# Patient Record
Sex: Female | Born: 1985
Health system: Southern US, Community
[De-identification: ages and names within clinical notes are randomized; demographics above are authoritative.]

## PROBLEM LIST (undated history)

## (undated) DIAGNOSIS — I10 Essential (primary) hypertension: Secondary | ICD-10-CM

## (undated) DIAGNOSIS — E669 Obesity, unspecified: Secondary | ICD-10-CM

## (undated) DIAGNOSIS — E88819 Insulin resistance, unspecified: Secondary | ICD-10-CM

## (undated) DIAGNOSIS — E8881 Metabolic syndrome: Secondary | ICD-10-CM

## (undated) DIAGNOSIS — F902 Attention-deficit hyperactivity disorder, combined type: Secondary | ICD-10-CM

## (undated) DIAGNOSIS — M357 Hypermobility syndrome: Secondary | ICD-10-CM

## (undated) HISTORY — DX: Insulin resistance, unspecified: E88.819

## (undated) HISTORY — DX: Attention-deficit hyperactivity disorder, combined type: F90.2

## (undated) HISTORY — DX: Essential (primary) hypertension: I10

## (undated) HISTORY — DX: Obesity, unspecified: E66.9

## (undated) HISTORY — PX: WISDOM TOOTH EXTRACTION: SHX21

## (undated) HISTORY — DX: Hypermobility syndrome: M35.7

## (undated) HISTORY — DX: Metabolic syndrome: E88.81

---
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
## 2007-09-13 ENCOUNTER — Emergency Department (HOSPITAL_COMMUNITY): Admission: EM | Admit: 2007-09-13 | Discharge: 2007-09-13 | Payer: Self-pay | Admitting: Emergency Medicine

## 2010-04-30 ENCOUNTER — Emergency Department (HOSPITAL_COMMUNITY)
Admission: EM | Admit: 2010-04-30 | Discharge: 2010-04-30 | Payer: Self-pay | Source: Home / Self Care | Admitting: Emergency Medicine

## 2010-07-10 LAB — URINALYSIS, ROUTINE W REFLEX MICROSCOPIC
Bilirubin Urine: NEGATIVE
Glucose, UA: NEGATIVE mg/dL
Hgb urine dipstick: NEGATIVE
Ketones, ur: NEGATIVE mg/dL
Nitrite: NEGATIVE
Protein, ur: NEGATIVE mg/dL
Specific Gravity, Urine: 1.03 (ref 1.005–1.030)
Urobilinogen, UA: 0.2 mg/dL (ref 0.0–1.0)
pH: 6 (ref 5.0–8.0)

## 2010-07-10 LAB — CBC
HCT: 45.4 % (ref 36.0–46.0)
Hemoglobin: 15.7 g/dL — ABNORMAL HIGH (ref 12.0–15.0)
MCH: 28.8 pg (ref 26.0–34.0)
MCHC: 34.6 g/dL (ref 30.0–36.0)
MCV: 83.3 fL (ref 78.0–100.0)
Platelets: 141 10*3/uL — ABNORMAL LOW (ref 150–400)
RBC: 5.45 MIL/uL — ABNORMAL HIGH (ref 3.87–5.11)
RDW: 13.1 % (ref 11.5–15.5)
WBC: 11.1 10*3/uL — ABNORMAL HIGH (ref 4.0–10.5)

## 2010-07-10 LAB — BASIC METABOLIC PANEL
BUN: 19 mg/dL (ref 6–23)
CO2: 22 mEq/L (ref 19–32)
Calcium: 8.9 mg/dL (ref 8.4–10.5)
Chloride: 109 mEq/L (ref 96–112)
Creatinine, Ser: 0.85 mg/dL (ref 0.4–1.2)
GFR calc Af Amer: >60 mL/min (ref >60)
GFR calc non Af Amer: >60 mL/min (ref >60)
Glucose, Bld: 137 mg/dL — ABNORMAL HIGH (ref 70–99)
Potassium: 4.1 mEq/L (ref 3.5–5.1)
Sodium: 139 mEq/L (ref 135–145)

## 2010-07-10 LAB — URINE MICROSCOPIC-ADD ON

## 2010-07-10 LAB — POCT PREGNANCY, URINE
Preg Test, Ur: NEGATIVE
Preg Test, Ur: NEGATIVE

## 2011-01-24 LAB — CBC
HCT: 46
Hemoglobin: 15.7 — ABNORMAL HIGH
MCHC: 34.1
MCV: 81.8
Platelets: 116 — ABNORMAL LOW
RBC: 5.63 — ABNORMAL HIGH
RDW: 12.3
WBC: 8.3

## 2011-01-24 LAB — COMPREHENSIVE METABOLIC PANEL
ALT: 31
AST: 23
Albumin: 3.3 — ABNORMAL LOW
Alkaline Phosphatase: 78
BUN: 17
CO2: 21
Calcium: 8.6
Chloride: 103
Creatinine, Ser: 0.78
GFR calc Af Amer: >60
GFR calc non Af Amer: >60
Glucose, Bld: 123 — ABNORMAL HIGH
Potassium: 3.4 — ABNORMAL LOW
Sodium: 135
Total Bilirubin: 1
Total Protein: 6.4

## 2011-01-24 LAB — URINALYSIS, ROUTINE W REFLEX MICROSCOPIC
Bilirubin Urine: NEGATIVE
Glucose, UA: NEGATIVE
Hgb urine dipstick: NEGATIVE
Nitrite: NEGATIVE
Protein, ur: NEGATIVE
Specific Gravity, Urine: 1.033 — ABNORMAL HIGH
Urobilinogen, UA: 1
pH: 5.5

## 2011-01-24 LAB — DIFFERENTIAL
Basophils Absolute: 0
Basophils Relative: 0
Eosinophils Absolute: 0
Eosinophils Relative: 0
Lymphocytes Relative: 2 — ABNORMAL LOW
Lymphs Abs: 0.2 — ABNORMAL LOW
Monocytes Absolute: 0.1
Monocytes Relative: 2 — ABNORMAL LOW
Neutro Abs: 8 — ABNORMAL HIGH
Neutrophils Relative %: 97 — ABNORMAL HIGH

## 2011-01-24 LAB — PREGNANCY, URINE: Preg Test, Ur: NEGATIVE

## 2011-01-24 LAB — LIPASE, BLOOD: Lipase: 37

## 2012-04-04 ENCOUNTER — Other Ambulatory Visit (INDEPENDENT_AMBULATORY_CARE_PROVIDER_SITE_OTHER): Payer: Self-pay | Admitting: General Surgery

## 2012-04-04 ENCOUNTER — Encounter (INDEPENDENT_AMBULATORY_CARE_PROVIDER_SITE_OTHER): Payer: Self-pay | Admitting: General Surgery

## 2012-04-04 ENCOUNTER — Ambulatory Visit (INDEPENDENT_AMBULATORY_CARE_PROVIDER_SITE_OTHER): Payer: Commercial Managed Care - PPO | Admitting: General Surgery

## 2012-04-04 VITALS — BP 116/76 | HR 72 | Temp 97.6°F | Resp 18 | Ht 66.0 in | Wt 263.1 lb

## 2012-04-04 DIAGNOSIS — Z6841 Body Mass Index (BMI) 40.0 and over, adult: Secondary | ICD-10-CM

## 2012-04-04 NOTE — Progress Notes (Signed)
Patient ID: Laura Werner, female   DOB: 19-Dec-1985, 26 y.o.   MRN: [id number]  Chief Complaint  Patient presents with  . New Evaluation    Lap Band - initial    HPI Laura Werner is a 26 y.o. female.  This patient presents for her initial weight loss surgery consultation. She has a tender information session in is interested in the lap band. She has trouble with her weight ever since she was a child and has done several diets and continues to exercise without any significant improvement. She is done Weight Watchers several times and some medication for weight loss but the most successful diet being a medication called Alli in which she lost 30 pounds. With each of these she regained the weight. 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 She looks at the lap band as being "the least invasive". She denies any reflux. HPI  History reviewed. No pertinent past medical history. ADHD History reviewed. No pertinent past surgical history.  Family History  Problem Relation Age of Onset  . Cancer Maternal Grandmother     melanoma  . Cancer Paternal Grandmother     lung  . Cancer Paternal Grandfather     lung    Social History History  Substance Use Topics  . Smoking status: Never Smoker   . Smokeless tobacco: Never Used  . Alcohol Use: No    No Known Allergies  Current Outpatient Prescriptions  Medication Sig Dispense Refill  . amphetamine-dextroamphetamine (ADDERALL) 20 MG tablet Take 20 mg by mouth daily.        Review of Systems Review of Systems All other review of systems negative or noncontributory except as stated in the HPI  Blood pressure 116/76, pulse 72, temperature 97.6 F (36.4 C), temperature source Temporal, resp. rate 18, height 5\' 6"  , weight 263 lb 2 oz (119.353 kg).  Physical Exam Physical Exam Physical Exam  Nursing note and vitals  reviewed. Constitutional: She is oriented to person, place, and time. She appears well-developed and well-nourished. No distress.  HENT:  Head: Normocephalic and atraumatic.  Mouth/Throat: No oropharyngeal exudate.  Eyes: Conjunctivae and EOM are normal. Pupils are equal, round, and reactive to light. Right eye exhibits no discharge. Left eye exhibits no discharge. No scleral icterus.  Neck: Normal range of motion. Neck supple. No tracheal deviation present.  Cardiovascular: Normal rate, regular rhythm, normal heart sounds and intact distal pulses.   Pulmonary/Chest: Effort normal and breath sounds normal. No stridor. No respiratory distress. She has no wheezes.  Abdominal: Soft. Bowel sounds are normal. She exhibits no distension and no mass. There is no tenderness. There is no rebound and no guarding.  Musculoskeletal: Normal range of motion. She exhibits no edema and no tenderness.  Neurological: She is alert and oriented to person, place, and time.  Skin: Skin is warm and dry. No rash noted. She is not diaphoretic. No erythema. No pallor.  Psychiatric: She has a normal mood and affect. Her behavior is normal. Judgment and thought content normal.    Data Reviewed   Assessment   morbid obesity with a BMI of 42 andotherwise fairly healthy except for ADHD. We had a long discussion regarding all of the nonsurgical and surgical options including the lap band, sleeve gastrectomy, Roux-en-Y gastric bypass. We discussed the pros and cons and the risks and benefits of each.  Originally, she was interested in the lap band because she felt that this would be "the least invasive". After further discussion she is not certain that she is interested in having the maintenance and followup required with the lap band and she is considering the sleeve gastrectomy. Again, we discussed all the risks of the procedures and I recommended that she take some additional time to do some further research and come up with  her preference. I think that she be a fine candidate for any the options she chooses. I spent at least 45 minutes counseling this patient.    Plan    We will go ahead and start her with the necessary preoperative workup and consultations including laboratory studies, upper GI, nutrition and psychology evaluations.  She will let us know which way she would like to go.       Lodema Pilot DAVID 04/04/2012, 1:12 PM

## 2012-04-15 ENCOUNTER — Encounter: Payer: 59 | Attending: General Surgery | Admitting: *Deleted

## 2012-04-15 ENCOUNTER — Other Ambulatory Visit: Payer: Self-pay

## 2012-04-15 ENCOUNTER — Encounter: Payer: Self-pay | Admitting: *Deleted

## 2012-04-15 ENCOUNTER — Ambulatory Visit (HOSPITAL_COMMUNITY)
Admission: RE | Admit: 2012-04-15 | Discharge: 2012-04-15 | Disposition: A | Discharge status: Home / Self Care | Payer: 59 | Source: Ambulatory Visit | Attending: General Surgery | Admitting: General Surgery

## 2012-04-15 VITALS — Ht 66.0 in | Wt 263.8 lb

## 2012-04-15 DIAGNOSIS — K219 Gastro-esophageal reflux disease without esophagitis: Secondary | ICD-10-CM | POA: Insufficient documentation

## 2012-04-15 DIAGNOSIS — Z6841 Body Mass Index (BMI) 40.0 and over, adult: Secondary | ICD-10-CM | POA: Insufficient documentation

## 2012-04-15 DIAGNOSIS — Z01818 Encounter for other preprocedural examination: Secondary | ICD-10-CM | POA: Insufficient documentation

## 2012-04-15 DIAGNOSIS — F909 Attention-deficit hyperactivity disorder, unspecified type: Secondary | ICD-10-CM | POA: Insufficient documentation

## 2012-04-15 DIAGNOSIS — Z713 Dietary counseling and surveillance: Secondary | ICD-10-CM | POA: Insufficient documentation

## 2012-04-15 NOTE — Progress Notes (Signed)
  Pre-Op Assessment Visit:  Pre-Operative Gastric Sleeve Surgery  Medical Nutrition Therapy:  Appt start time:  0845   End time:  930.  Patient was seen on 04/15/2012 for Pre-Operative Gastric Sleeve Nutrition Assessment. Assessment and letter of approval faxed to [facility] B[facility] Program coordinator on 04/15/2012.  Approval letter sent to [facility] and will be available in the chart under the media tab.  Handouts given during visit include:  Pre-Op Goals   Bariatric Surgery Protein Shakes  Patient to call for Pre-Op and Post-Op Nutrition Education at the [facility] when surgery is scheduled.

## 2012-04-15 NOTE — Patient Instructions (Addendum)
   Follow Pre-Op Nutrition Goals to prepare for Gastric Sleeve Surgery.   Call the [facility] at [phone] once you have been given your surgery date to enrolled in the Pre-Op Nutrition Class. You will need to attend this nutrition class 3-4 weeks prior to your surgery.  

## 2012-05-22 LAB — CBC
HCT: 41.5 % (ref 36.0–46.0)
Hemoglobin: 14.4 g/dL (ref 12.0–15.0)
MCH: 27.6 pg (ref 26.0–34.0)
MCHC: 34.7 g/dL (ref 30.0–36.0)
MCV: 79.7 fL (ref 78.0–100.0)
Platelets: 218 10*3/uL (ref 150–400)
RBC: 5.21 MIL/uL — ABNORMAL HIGH (ref 3.87–5.11)
RDW: 14 % (ref 11.5–15.5)
WBC: 7 10*3/uL (ref 4.0–10.5)

## 2012-05-22 LAB — COMPREHENSIVE METABOLIC PANEL
ALT: 28 U/L (ref 0–35)
AST: 22 U/L (ref 0–37)
Albumin: 4.4 g/dL (ref 3.5–5.2)
Alkaline Phosphatase: 94 U/L (ref 39–117)
BUN: 13 mg/dL (ref 6–23)
CO2: 30 mEq/L (ref 19–32)
Calcium: 9.5 mg/dL (ref 8.4–10.5)
Chloride: 102 mEq/L (ref 96–112)
Creat: 0.71 mg/dL (ref 0.50–1.10)
Glucose, Bld: 71 mg/dL (ref 70–99)
Potassium: 4.3 mEq/L (ref 3.5–5.3)
Sodium: 141 mEq/L (ref 135–145)
Total Bilirubin: 0.3 mg/dL (ref 0.3–1.2)
Total Protein: 6.7 g/dL (ref 6.0–8.3)

## 2012-05-22 LAB — LIPID PANEL
Cholesterol: 147 mg/dL (ref 0–200)
HDL: 47 mg/dL (ref >39)
LDL Cholesterol: 74 mg/dL (ref 0–99)
Total CHOL/HDL Ratio: 3.1 Ratio
Triglycerides: 130 mg/dL (ref <150)
VLDL: 26 mg/dL (ref 0–40)

## 2012-05-22 LAB — T4: T4, Total: 10.5 ug/dL (ref 5.0–12.5)

## 2012-05-22 LAB — TSH: TSH: 2.205 u[IU]/mL (ref 0.350–4.500)

## 2012-05-23 LAB — HELICOBACTER PYLORI ABS-IGG+IGA, BLD
H Pylori IgG: <0.4 {ISR}
HELICOBACTER PYLORI AB, IGA: 1.8 U/mL (ref <9.0)

## 2012-06-19 ENCOUNTER — Encounter (INDEPENDENT_AMBULATORY_CARE_PROVIDER_SITE_OTHER): Payer: Self-pay

## 2013-02-12 ENCOUNTER — Ambulatory Visit (INDEPENDENT_AMBULATORY_CARE_PROVIDER_SITE_OTHER): Payer: 59 | Admitting: Surgery

## 2013-02-12 ENCOUNTER — Encounter (INDEPENDENT_AMBULATORY_CARE_PROVIDER_SITE_OTHER): Payer: Self-pay | Admitting: Surgery

## 2013-02-12 DIAGNOSIS — K2100 Gastro-esophageal reflux disease with esophagitis, without bleeding: Secondary | ICD-10-CM

## 2013-02-12 DIAGNOSIS — Z6841 Body Mass Index (BMI) 40.0 and over, adult: Secondary | ICD-10-CM

## 2013-02-12 DIAGNOSIS — E6609 Other obesity due to excess calories: Secondary | ICD-10-CM | POA: Insufficient documentation

## 2013-02-12 NOTE — Progress Notes (Signed)
Chief Complaint:  Has 30% to workup for bariatric surgery and got approval. Once proceed with laparoscopic sleeve gastrectomy  History of Present Illness:  Laura Werner is an 27 y.o. female who saw Dr. Biagio Quint last year and completed the workup in December and January for bariatric surgery. She still waiting on a letter from Dr. Abigail Miyamoto otherwise is ready to proceed now with a sleeve gastrectomy. Her upper GI showed some mild GE reflux no evidence of a hiatal hernia. Her laboratory is otherwise pretty unremarkable. She was switching from Dr. Biagio Quint to me in light of his impending departure from the practice. She is research sleeve gastrectomy and is pretty well sent home with she would like. She is always had weight issues it seems that a more radical change in her physiology as necessary to help her achieve and sustain weight loss.  Past Medical History  Diagnosis Date  . Morbid obesity with BMI of 40.0-44.9, adult 04/15/12    BMI 42.3    History reviewed. No pertinent past surgical history.  Current Outpatient Prescriptions  Medication Sig Dispense Refill  . amphetamine-dextroamphetamine (ADDERALL) 20 MG tablet Take 20 mg by mouth daily.       No current facility-administered medications for this visit.   Review of patient's allergies indicates no known allergies. Family History  Problem Relation Age of Onset  . Cancer Maternal Grandmother     melanoma  . Cancer Paternal Grandmother     lung  . Cancer Paternal Grandfather     lung  . Hypertension Other    Social History:   reports that she has never smoked. She has never used smokeless tobacco. She reports that she drinks alcohol. She reports that she does not use illicit drugs.   REVIEW OF SYSTEMS - PERTINENT POSITIVES ONLY: Negative for DVT her prior abdominal surgery.  Physical Exam:   Blood pressure 114/80, pulse 68, temperature 98.2 F (36.8 C), temperature source Temporal, resp. rate 14, height 5\' 6"  (1.676 m),  weight 273 lb 3.2 oz (123.923 kg). Body mass index is 44.12 kg/(m^2).  Gen:  WDWN white female NAD  Neurological: Alert and oriented to person, place, and time. Motor and sensory function is grossly intact  Head: Normocephalic and atraumatic.  Eyes: Conjunctivae are normal. Pupils are equal, round, and reactive to light. No scleral icterus.  Neck: Normal range of motion. Neck supple. No tracheal deviation or thyromegaly present.  Cardiovascular:  SR without murmurs or gallops.  No carotid bruits Respiratory: Effort normal.  No respiratory distress. No chest wall tenderness. Breath sounds normal.  No wheezes, rales or rhonchi.  Abdomen:  Obese nontender GU: Musculoskeletal: Normal range of motion. Extremities are nontender. No cyanosis, edema or clubbing noted Lymphadenopathy: No cervical, preauricular, postauricular or axillary adenopathy is present Skin: Skin is warm and dry. No rash noted. No diaphoresis. No erythema. No pallor. Pscyh: Normal mood and affect. Behavior is normal. Judgment and thought content normal.   LABORATORY RESULTS: No results found for this or any previous visit (from the past 48 hour(s)).  RADIOLOGY RESULTS: No results found.  Problem List: There are no active problems to display for this patient.   Assessment & Plan: Morbid obesity BMI 44 4 laparoscopic sleeve gastrectomy.    Matt B. Daphine Deutscher, MD, [facility] [phone] beeper [phone]  02/12/2013 11:36 AM

## 2013-02-12 NOTE — Patient Instructions (Signed)

## 2013-09-24 IMAGING — RF DG UGI W/ KUB
15 of 16 series · 15 of 16 positions shown · non-contrast
Comparison: none

CLINICAL DATA: Morbid obesity.  Pre-op evaluation for bariatric
surgery.

UPPER GI SERIES WITH KUB
TECHNIQUE: After obtaining a scout radiograph a single-column
upper GI series was performed using thin barium.
Fluoroscopy time: 1.8 minutes

[Series 1: run · 1 of 1 slices shown (1 of 13)]
[im 1/1]
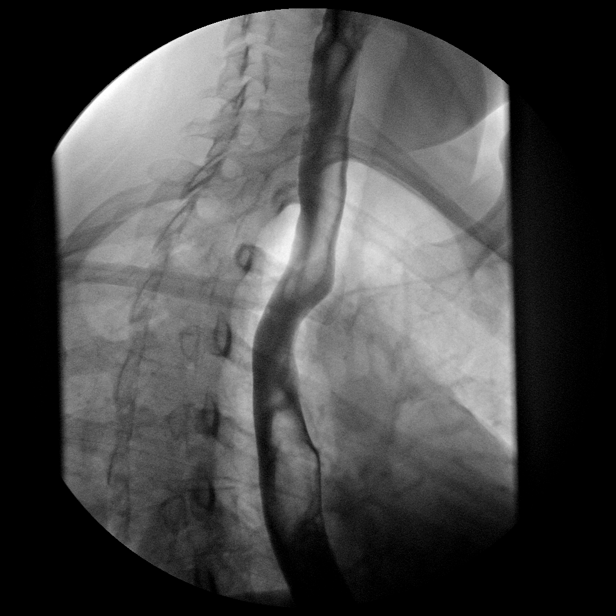

[Series 2: run · 1 of 1 slices shown (2 of 13)]
[im 1/1]
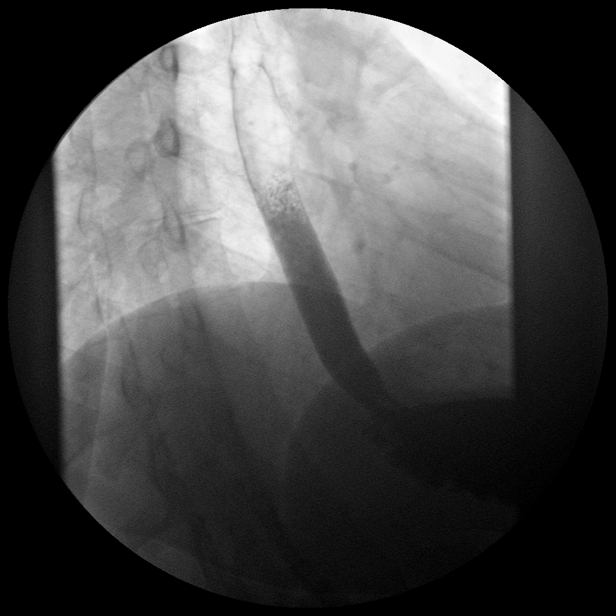

[Series 3: run · 1 of 1 slices shown (3 of 13)]
[im 1/1]
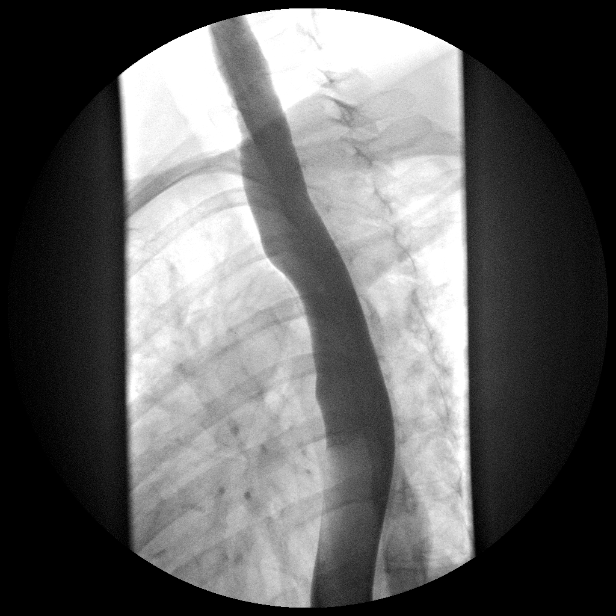

[Series 4: run · 1 of 1 slices shown (4 of 13)]
[im 1/1]
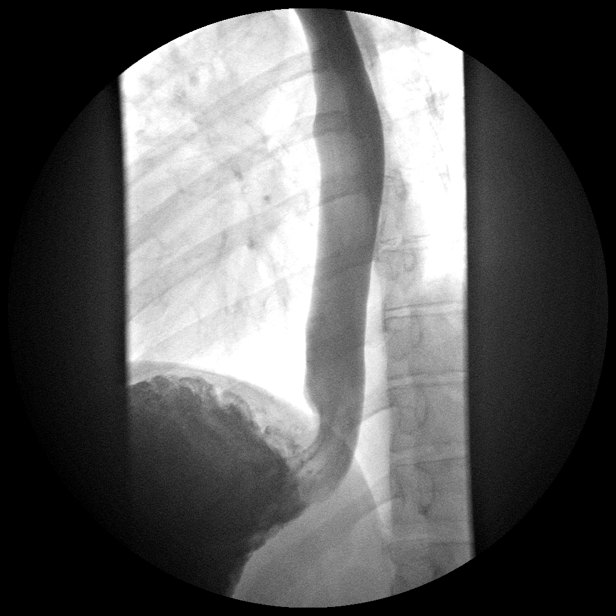

[Series 5: run · 1 of 1 slices shown (5 of 13)]
[im 1/1]
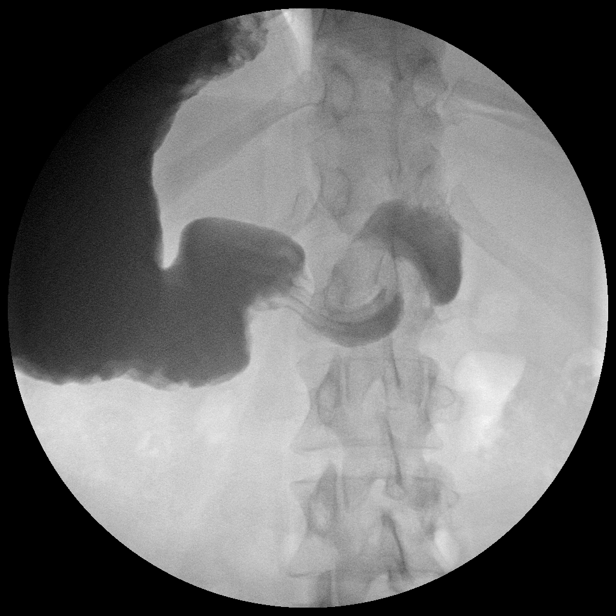

[Series 6: run · 1 of 1 slices shown (6 of 13)]
[im 1/1]
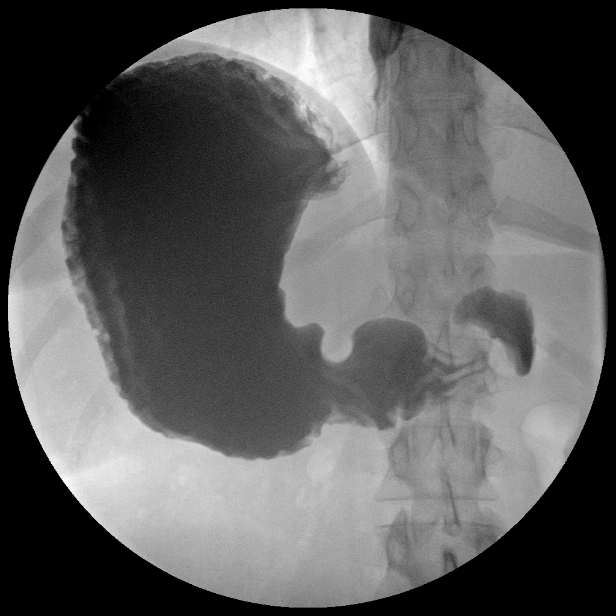

[Series 7: run · 1 of 1 slices shown (7 of 13)]
[im 1/1]
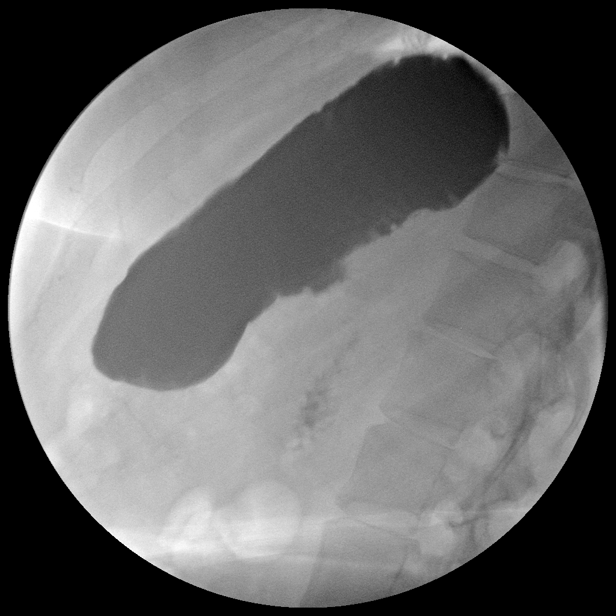

[Series 9: run · 1 of 1 slices shown (8 of 13)]
[im 1/1]
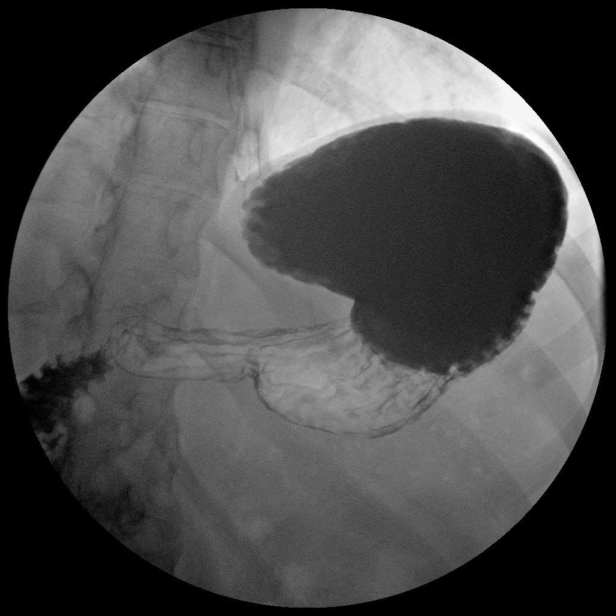

[Series 10: run · 1 of 1 slices shown (9 of 13)]
[im 1/1]
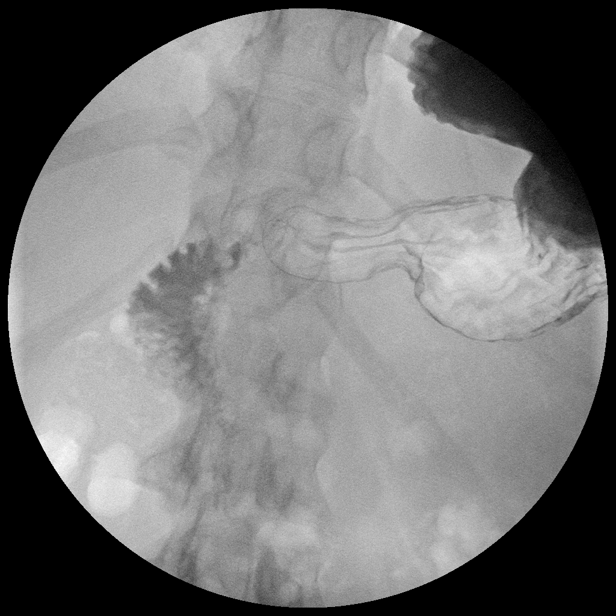

[Series 11: run · 1 of 1 slices shown (10 of 13)]
[im 1/1]
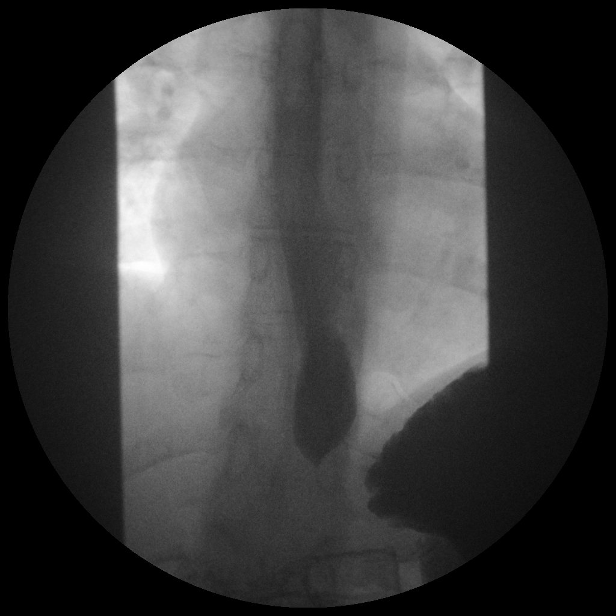

[Series 12: run · 1 of 1 slices shown (11 of 13)]
[im 1/1]
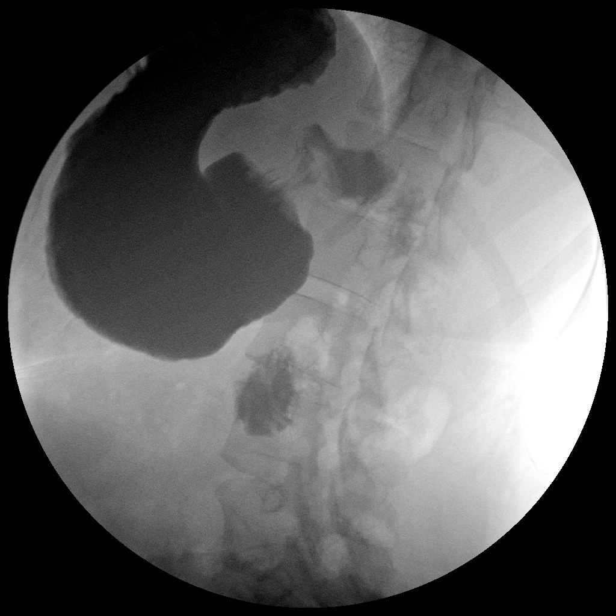

[Series 13: run · 1 of 1 slices shown (12 of 13)]
[im 1/1]
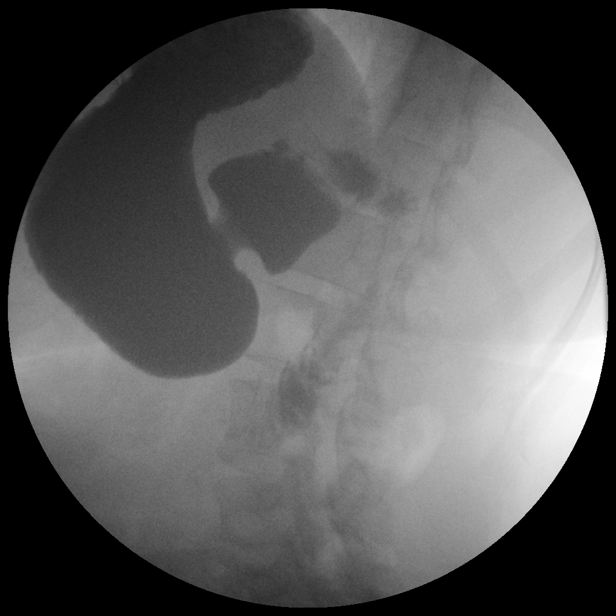

[Series 14: run · 1 of 1 slices shown (13 of 13)]
[im 1/1]
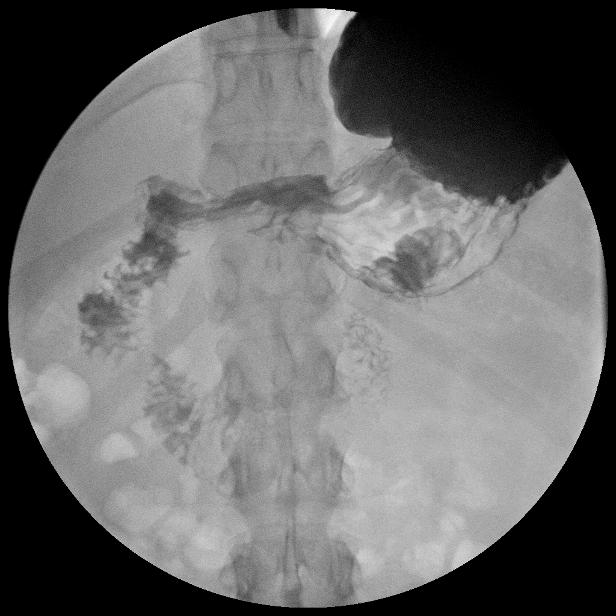

[Series 1001: view not recorded · 0.20mm/px · 1 of 1 slices shown (1 of 2)]
[im 1/1]
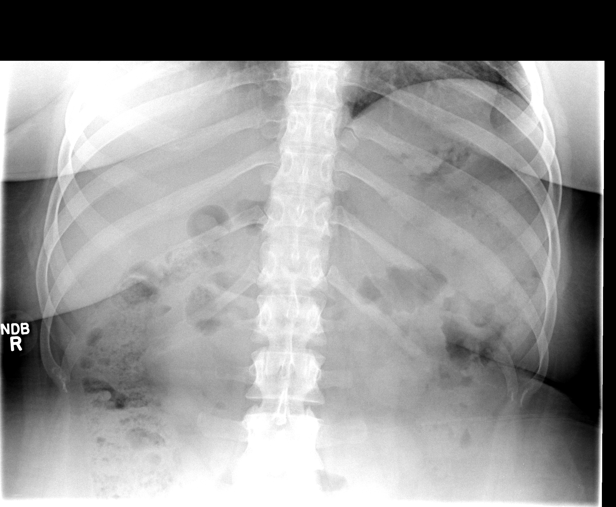

[Series 1002: view not recorded · 0.20mm/px · 1 of 1 slices shown (2 of 2)]
[im 1/1]
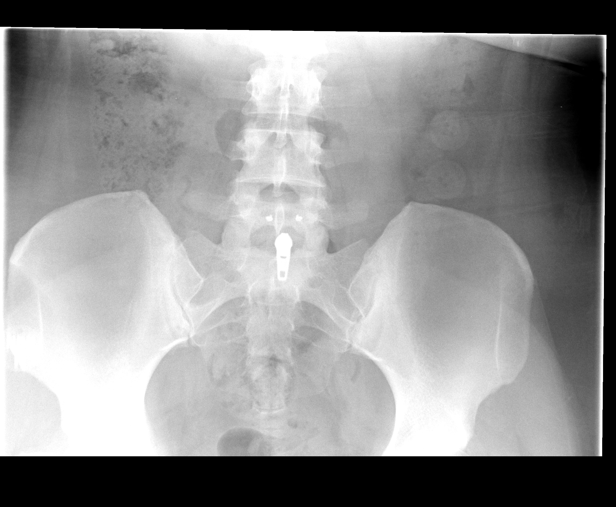

[15 of 16 positions shown; findings below may reference images not displayed]

FINDINGS: The scout radiograph shows a normal bowel gas pattern.

There is no evidence of esophageal mass or stricture.  There is no
evidence of hiatal hernia.  Mild gastroesophageal reflux was seen
during the exam to the level of the mid thoracic esophagus.
Esophageal motility is within normal limits.

The stomach is normal in appearance.  There is no evidence of
gastric masses or ulcers.  Duodenal bulb and sweep are normal in
appearance.
IMPRESSION: Mild gastroesophageal reflux.  No evidence of hiatal hernia,
esophageal stricture, or other significant findings.

## 2016-04-30 HISTORY — PX: ARTHROSCOPIC REPAIR ACL: SUR80

## 2016-12-29 DIAGNOSIS — M238X2 Other internal derangements of left knee: Secondary | ICD-10-CM | POA: Diagnosis not present

## 2017-01-03 DIAGNOSIS — M25562 Pain in left knee: Secondary | ICD-10-CM | POA: Diagnosis not present

## 2017-01-07 DIAGNOSIS — M25562 Pain in left knee: Secondary | ICD-10-CM | POA: Diagnosis not present

## 2017-02-07 DIAGNOSIS — X58XXXA Exposure to other specified factors, initial encounter: Secondary | ICD-10-CM | POA: Diagnosis not present

## 2017-02-07 DIAGNOSIS — G8918 Other acute postprocedural pain: Secondary | ICD-10-CM | POA: Diagnosis not present

## 2017-02-07 DIAGNOSIS — M23612 Other spontaneous disruption of anterior cruciate ligament of left knee: Secondary | ICD-10-CM | POA: Diagnosis not present

## 2017-02-07 DIAGNOSIS — S83512A Sprain of anterior cruciate ligament of left knee, initial encounter: Secondary | ICD-10-CM | POA: Diagnosis not present

## 2017-02-07 DIAGNOSIS — S83242A Other tear of medial meniscus, current injury, left knee, initial encounter: Secondary | ICD-10-CM | POA: Diagnosis not present

## 2017-02-07 DIAGNOSIS — S83282A Other tear of lateral meniscus, current injury, left knee, initial encounter: Secondary | ICD-10-CM | POA: Diagnosis not present

## 2017-02-18 DIAGNOSIS — S83242A Other tear of medial meniscus, current injury, left knee, initial encounter: Secondary | ICD-10-CM | POA: Diagnosis not present

## 2017-02-22 DIAGNOSIS — M25562 Pain in left knee: Secondary | ICD-10-CM | POA: Diagnosis not present

## 2017-02-22 DIAGNOSIS — M6281 Muscle weakness (generalized): Secondary | ICD-10-CM | POA: Diagnosis not present

## 2017-02-22 DIAGNOSIS — M25662 Stiffness of left knee, not elsewhere classified: Secondary | ICD-10-CM | POA: Diagnosis not present

## 2017-02-27 DIAGNOSIS — M6281 Muscle weakness (generalized): Secondary | ICD-10-CM | POA: Diagnosis not present

## 2017-02-27 DIAGNOSIS — M25562 Pain in left knee: Secondary | ICD-10-CM | POA: Diagnosis not present

## 2017-02-27 DIAGNOSIS — M25662 Stiffness of left knee, not elsewhere classified: Secondary | ICD-10-CM | POA: Diagnosis not present

## 2017-02-28 DIAGNOSIS — M25562 Pain in left knee: Secondary | ICD-10-CM | POA: Diagnosis not present

## 2017-02-28 DIAGNOSIS — M6281 Muscle weakness (generalized): Secondary | ICD-10-CM | POA: Diagnosis not present

## 2017-02-28 DIAGNOSIS — M25662 Stiffness of left knee, not elsewhere classified: Secondary | ICD-10-CM | POA: Diagnosis not present

## 2017-03-13 DIAGNOSIS — J069 Acute upper respiratory infection, unspecified: Secondary | ICD-10-CM | POA: Diagnosis not present

## 2017-03-14 DIAGNOSIS — M25662 Stiffness of left knee, not elsewhere classified: Secondary | ICD-10-CM | POA: Diagnosis not present

## 2017-03-14 DIAGNOSIS — M6281 Muscle weakness (generalized): Secondary | ICD-10-CM | POA: Diagnosis not present

## 2017-03-14 DIAGNOSIS — M25562 Pain in left knee: Secondary | ICD-10-CM | POA: Diagnosis not present

## 2017-03-18 DIAGNOSIS — M6281 Muscle weakness (generalized): Secondary | ICD-10-CM | POA: Diagnosis not present

## 2017-03-18 DIAGNOSIS — M25662 Stiffness of left knee, not elsewhere classified: Secondary | ICD-10-CM | POA: Diagnosis not present

## 2017-03-18 DIAGNOSIS — M25562 Pain in left knee: Secondary | ICD-10-CM | POA: Diagnosis not present

## 2017-03-27 DIAGNOSIS — M6281 Muscle weakness (generalized): Secondary | ICD-10-CM | POA: Diagnosis not present

## 2017-03-27 DIAGNOSIS — M25562 Pain in left knee: Secondary | ICD-10-CM | POA: Diagnosis not present

## 2017-03-27 DIAGNOSIS — M25662 Stiffness of left knee, not elsewhere classified: Secondary | ICD-10-CM | POA: Diagnosis not present

## 2017-04-01 DIAGNOSIS — M6281 Muscle weakness (generalized): Secondary | ICD-10-CM | POA: Diagnosis not present

## 2017-04-01 DIAGNOSIS — M25562 Pain in left knee: Secondary | ICD-10-CM | POA: Diagnosis not present

## 2017-04-01 DIAGNOSIS — M25662 Stiffness of left knee, not elsewhere classified: Secondary | ICD-10-CM | POA: Diagnosis not present

## 2017-04-02 DIAGNOSIS — M6281 Muscle weakness (generalized): Secondary | ICD-10-CM | POA: Diagnosis not present

## 2017-04-02 DIAGNOSIS — M25562 Pain in left knee: Secondary | ICD-10-CM | POA: Diagnosis not present

## 2017-04-02 DIAGNOSIS — M25662 Stiffness of left knee, not elsewhere classified: Secondary | ICD-10-CM | POA: Diagnosis not present

## 2017-04-11 DIAGNOSIS — M6281 Muscle weakness (generalized): Secondary | ICD-10-CM | POA: Diagnosis not present

## 2017-04-11 DIAGNOSIS — M25662 Stiffness of left knee, not elsewhere classified: Secondary | ICD-10-CM | POA: Diagnosis not present

## 2017-04-11 DIAGNOSIS — M25562 Pain in left knee: Secondary | ICD-10-CM | POA: Diagnosis not present

## 2017-04-15 DIAGNOSIS — M25562 Pain in left knee: Secondary | ICD-10-CM | POA: Diagnosis not present

## 2017-04-15 DIAGNOSIS — M6281 Muscle weakness (generalized): Secondary | ICD-10-CM | POA: Diagnosis not present

## 2017-04-15 DIAGNOSIS — M25662 Stiffness of left knee, not elsewhere classified: Secondary | ICD-10-CM | POA: Diagnosis not present

## 2017-04-16 DIAGNOSIS — M6281 Muscle weakness (generalized): Secondary | ICD-10-CM | POA: Diagnosis not present

## 2017-04-16 DIAGNOSIS — M25662 Stiffness of left knee, not elsewhere classified: Secondary | ICD-10-CM | POA: Diagnosis not present

## 2017-04-16 DIAGNOSIS — M25562 Pain in left knee: Secondary | ICD-10-CM | POA: Diagnosis not present

## 2017-04-25 DIAGNOSIS — M25662 Stiffness of left knee, not elsewhere classified: Secondary | ICD-10-CM | POA: Diagnosis not present

## 2017-04-25 DIAGNOSIS — M6281 Muscle weakness (generalized): Secondary | ICD-10-CM | POA: Diagnosis not present

## 2017-04-25 DIAGNOSIS — M25562 Pain in left knee: Secondary | ICD-10-CM | POA: Diagnosis not present

## 2017-05-13 DIAGNOSIS — M25662 Stiffness of left knee, not elsewhere classified: Secondary | ICD-10-CM | POA: Diagnosis not present

## 2017-05-13 DIAGNOSIS — M25562 Pain in left knee: Secondary | ICD-10-CM | POA: Diagnosis not present

## 2017-05-13 DIAGNOSIS — M6281 Muscle weakness (generalized): Secondary | ICD-10-CM | POA: Diagnosis not present

## 2017-05-14 DIAGNOSIS — M25662 Stiffness of left knee, not elsewhere classified: Secondary | ICD-10-CM | POA: Diagnosis not present

## 2017-05-14 DIAGNOSIS — M25562 Pain in left knee: Secondary | ICD-10-CM | POA: Diagnosis not present

## 2017-05-14 DIAGNOSIS — M6281 Muscle weakness (generalized): Secondary | ICD-10-CM | POA: Diagnosis not present

## 2017-05-23 DIAGNOSIS — M25662 Stiffness of left knee, not elsewhere classified: Secondary | ICD-10-CM | POA: Diagnosis not present

## 2017-05-23 DIAGNOSIS — M25562 Pain in left knee: Secondary | ICD-10-CM | POA: Diagnosis not present

## 2017-05-23 DIAGNOSIS — M6281 Muscle weakness (generalized): Secondary | ICD-10-CM | POA: Diagnosis not present

## 2017-05-27 DIAGNOSIS — M25562 Pain in left knee: Secondary | ICD-10-CM | POA: Diagnosis not present

## 2017-05-27 DIAGNOSIS — M6281 Muscle weakness (generalized): Secondary | ICD-10-CM | POA: Diagnosis not present

## 2017-05-27 DIAGNOSIS — M25662 Stiffness of left knee, not elsewhere classified: Secondary | ICD-10-CM | POA: Diagnosis not present

## 2017-06-06 DIAGNOSIS — M25562 Pain in left knee: Secondary | ICD-10-CM | POA: Diagnosis not present

## 2017-06-06 DIAGNOSIS — M25662 Stiffness of left knee, not elsewhere classified: Secondary | ICD-10-CM | POA: Diagnosis not present

## 2017-06-06 DIAGNOSIS — M6281 Muscle weakness (generalized): Secondary | ICD-10-CM | POA: Diagnosis not present

## 2017-06-24 DIAGNOSIS — M25662 Stiffness of left knee, not elsewhere classified: Secondary | ICD-10-CM | POA: Diagnosis not present

## 2017-06-24 DIAGNOSIS — M6281 Muscle weakness (generalized): Secondary | ICD-10-CM | POA: Diagnosis not present

## 2017-06-24 DIAGNOSIS — M25562 Pain in left knee: Secondary | ICD-10-CM | POA: Diagnosis not present

## 2017-07-03 DIAGNOSIS — M25562 Pain in left knee: Secondary | ICD-10-CM | POA: Diagnosis not present

## 2017-07-10 DIAGNOSIS — J00 Acute nasopharyngitis [common cold]: Secondary | ICD-10-CM | POA: Diagnosis not present

## 2017-07-12 DIAGNOSIS — J039 Acute tonsillitis, unspecified: Secondary | ICD-10-CM | POA: Diagnosis not present

## 2017-07-12 DIAGNOSIS — J029 Acute pharyngitis, unspecified: Secondary | ICD-10-CM | POA: Diagnosis not present

## 2017-07-12 DIAGNOSIS — J00 Acute nasopharyngitis [common cold]: Secondary | ICD-10-CM | POA: Diagnosis not present

## 2017-09-17 ENCOUNTER — Ambulatory Visit: Payer: 59 | Admitting: Family Medicine

## 2017-09-17 NOTE — Progress Notes (Deleted)
Subjective:    Laura Werner is a 32 y.o. female and is here for a comprehensive physical exam.  Pertinent Gynecological History: No LMP recorded. Sexually active: {CHL AMB SEXUALLY ACTIVE:210950101} Menses: {menses:16152} Bleeding: {uterine bleeding:32112} Contraception: {contraception:5051} DES exposure: {denies/unknown:32108} Blood transfusions: {none:33079} Sexually transmitted diseases: {std risk:32110} Previous GYN Procedures: {previous procedures:3041388}  Last mammogram: {normal/abnormal***:32111} Date: *** Last pap: {normal/abnormal***:32111} Date: ***  OB History   None     Health Maintenance Due  Topic Date Due  . HIV Screening  07/21/2001  . TETANUS/TDAP  07/21/2005  . PAP SMEAR  07/22/2007    PMHx, SurgHx, SocialHx, Medications, and Allergies were reviewed in the Visit Navigator and updated as appropriate.   Past Medical History:  Diagnosis Date  . Morbid obesity with BMI of 40.0-44.9, adult 04/15/12   BMI 42.3   No past surgical history on file. Family History  Problem Relation Age of Onset  . Cancer Maternal Grandmother        melanoma  . Cancer Paternal Grandmother        lung  . Cancer Paternal Grandfather        lung  . Hypertension Other    Social History   Tobacco Use  . Smoking status: Never Smoker  . Smokeless tobacco: Never Used  Substance Use Topics  . Alcohol use: Yes    Comment: 1-2 drinks/week or less  . Drug use: No    Review of Systems:   Pertinent items are noted in the HPI. Otherwise, ROS is negative.  Objective:   There were no vitals taken for this visit.  Wt Readings from Last 3 Encounters:  02/12/13 273 lb 3.2 oz (123.9 kg)  04/15/12 263 lb 12.8 oz (119.7 kg)  04/04/12 263 lb 2 oz (119.4 kg)     Ht Readings from Last 3 Encounters:  02/12/13 5\' 6"  (1.676 m)  04/15/12 5\' 6"  (1.676 m)  04/04/12 5\' 6"  (1.676 m)    General appearance: alert, cooperative and appears stated age. Head: normocephalic,  without obvious abnormality, atraumatic. Neck: no adenopathy, supple, symmetrical, trachea midline; thyroid not enlarged, symmetric, no tenderness/mass/nodules. Lungs: clear to auscultation bilaterally. Breasts: inspection negative, no nipple retraction or dimpling, no nipple discharge or bleeding, no axillary or supraclavicular adenopathy, normal to palpation without dominant masses. Heart: regular rate and rhythm Abdomen: soft, non-tender; no masses,  no organomegaly. Extremities: extremities normal, atraumatic, no cyanosis or edema. Skin: skin color, texture, turgor normal, no rashes or lesions. Lymph: cervical, supraclavicular, and axillary nodes normal; no abnormal inguinal nodes palpated. Neurologic: grossly normal.  Pelvic:  External genitalia: no lesions.              Urethra: normal appearing urethra with no masses, tenderness or lesions.              Bartholins and Skenes: normal.               Vagina: normal appearing vagina with normal color and discharge, no lesions.              Cervix: normal appearance.              Pap and high risk HPV testing done: {yes no:314532}.        Bimanual Exam:   Uterus: uterus is normal size, shape, consistency and nontender.  Adnexa: normal adnexa in size, nontender and no masses.                                      Rectovaginal: {yes no:314532}.                                      Confirms above.                                      Anus: normal sphincter tone, no lesions.   Assessment/Plan:   There are no diagnoses linked to this encounter.  Patient Counseling:   [x]     Nutrition: Stressed importance of moderation in sodium/caffeine intake, saturated fat and cholesterol, caloric balance, sufficient intake of fresh fruits, vegetables, fiber, calcium, iron, and 1 mg of folate supplement per day (for females capable of pregnancy).   [x]      Stressed the importance of regular exercise.    [x]      Substance Abuse: Discussed cessation/primary prevention of tobacco, alcohol, or other drug use; driving or other dangerous activities under the influence; availability of treatment for abuse.    [x]      Injury prevention: Discussed safety belts, safety helmets, smoke detector, smoking near bedding or upholstery.    [x]      Sexuality: Discussed sexually transmitted diseases, partner selection, use of condoms, avoidance of unintended pregnancy  and contraceptive alternatives.    [x]     Dental health: Discussed importance of regular tooth brushing, flossing, and dental visits.   [x]      Health maintenance and immunizations reviewed. Please refer to Health maintenance section.   Briscoe Deutscher, DO [place]

## 2017-10-13 NOTE — Progress Notes (Signed)
Laura Werner is a 32 y.o. female is here to [employer].   Patient Care Team: Briscoe Deutscher, DO as PCP - General (Family Medicine)   History of Present Illness:   HPI:   1. Attention deficit hyperactivity disorder (ADHD), unspecified ADHD type. Diagnosed in her mid 72s while getting her master's degree. 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 No palpitations, HA, or other problems when taking the medication.   2. Morbid obesity ([place]). At highest weight. Previously interested in sleeve gastrectomy, but insurance would not cover at that time. Not interested in it at this point. Has been successful with several weight loss attempts, but always regained.    3. Essential hypertension. Not multiple elevated BP numbers in office and when she has checked at the pharmacy. No CP, SOB, HA, dizziness, edema. No tobacco, excess ETOH, drugs, or supplements. Laura Werner with HTN. Diastolic usually high.   4. Sleep-disordered breathing. Snores. Denies having previous sleep study.   Health Maintenance Due  Topic Date Due  . HIV Screening  07/21/2001  . PAP SMEAR  07/22/2007   Depression screen PHQ 2/9 10/14/2017  Decreased Interest 0  Down, Depressed, Hopeless 0  PHQ - 2 Score 0    PMHx, SurgHx, SocialHx, Medications, and Allergies were reviewed in the Visit Navigator and updated as appropriate.   Past Medical History:  Diagnosis Date  . ADHD (attention deficit hyperactivity disorder), combined type   . Benign joint hypermobility   . HTN (hypertension)      Past Surgical History:  Procedure Laterality Date  . ARTHROSCOPIC REPAIR ACL  2018     Family History  Problem Relation Age of Onset  . Melanoma Maternal Laura Werner   . Lung cancer Paternal Laura Werner   . Lung cancer Paternal Laura Werner   . Cancer Laura Werner   . Depression Laura Werner   . Hypertension Laura Werner   . Alcohol abuse Laura Werner   . Hypertension Other     Social  History   Tobacco Use  . Smoking status: Never Smoker  . Smokeless tobacco: Never Used  Substance Use Topics  . Alcohol use: Yes    Comment: 1-2 drinks/week or less  . Drug use: No    Current Medications and Allergies:   Current Outpatient Medications:  .  amLODipine (NORVASC) 5 MG tablet, Take 1 tablet (5 mg total) by mouth daily., Disp: 30 tablet, Rfl: 0 .  amphetamine-dextroamphetamine (ADDERALL XR) 20 MG 24 hr capsule, Take 1 capsule (20 mg total) by mouth every morning., Disp: 30 capsule, Rfl: 0 .  [START ON 12/14/2017] amphetamine-dextroamphetamine (ADDERALL XR) 20 MG 24 hr capsule, Take 1 capsule (20 mg total) by mouth every morning., Disp: 30 capsule, Rfl: 0 .  [START ON 11/13/2017] amphetamine-dextroamphetamine (ADDERALL XR) 20 MG 24 hr capsule, Take 1 capsule (20 mg total) by mouth every morning., Disp: 30 capsule, Rfl: 0  No Known Allergies Review of Systems:   Pertinent items are noted in the HPI. Otherwise, ROS is negative.  Vitals:   Vitals:   10/14/17 1358  BP: (!) 130/98  Pulse: 89  Temp: 98.4 F (36.9 C)  TempSrc: Oral  SpO2: 99%  Weight: (!) 305 lb 6.4 oz (138.5 kg)  Height: 5\' 6"  (1.676 m)     Body mass index is 49.29 kg/m.  Physical Exam:   Physical Exam  Constitutional: She is oriented to person, place, and time. She appears well-developed and well-nourished. No distress.  HENT:  Head: Normocephalic and atraumatic.  Right Ear: External ear normal.  Left Ear: External ear normal.  Nose: Nose normal.  Mouth/Throat: Oropharynx is clear and moist.  Eyes: Pupils are equal, round, and reactive to light. Conjunctivae and EOM are normal.  Neck: Normal range of motion. Neck supple. No thyromegaly present.  Cardiovascular: Normal rate, regular rhythm, normal heart sounds and intact distal pulses.  Pulmonary/Chest: Effort normal and breath sounds normal.  Abdominal: Soft. Bowel sounds are normal.  Musculoskeletal: Normal range of motion.    Lymphadenopathy:    She has no cervical adenopathy.  Neurological: She is alert and oriented to person, place, and time.  Skin: Skin is warm and dry. Capillary refill takes less than 2 seconds.  Psychiatric: She has a normal mood and affect. Her behavior is normal.  Nursing note and vitals reviewed.   Results for orders placed or performed in visit on 10/14/17  CBC with Differential/Platelet  Result Value Ref Range   WBC 9.1 4.0 - 10.5 K/uL   RBC 4.84 3.87 - 5.11 Mil/uL   Hemoglobin 13.1 12.0 - 15.0 g/dL   HCT 38.2 36.0 - 46.0 %   MCV 79.0 78.0 - 100.0 fl   MCHC 34.4 30.0 - 36.0 g/dL   RDW 14.1 11.5 - 15.5 %   Platelets 186.0 150.0 - 400.0 K/uL   Neutrophils Relative % 71.2 43.0 - 77.0 %   Lymphocytes Relative 16.7 12.0 - 46.0 %   Monocytes Relative 5.6 3.0 - 12.0 %   Eosinophils Relative 6.3 (H) 0.0 - 5.0 %   Basophils Relative 0.2 0.0 - 3.0 %   Neutro Abs 6.4 1.4 - 7.7 K/uL   Lymphs Abs 1.5 0.7 - 4.0 K/uL   Monocytes Absolute 0.5 0.1 - 1.0 K/uL   Eosinophils Absolute 0.6 0.0 - 0.7 K/uL   Basophils Absolute 0.0 0.0 - 0.1 K/uL  Comprehensive metabolic panel  Result Value Ref Range   Sodium 139 135 - 145 mEq/L   Potassium 4.1 3.5 - 5.1 mEq/L   Chloride 105 96 - 112 mEq/L   CO2 27 19 - 32 mEq/L   Glucose, Bld 80 70 - 99 mg/dL   BUN 17 6 - 23 mg/dL   Creatinine, Ser 0.74 0.40 - 1.20 mg/dL   Total Bilirubin 0.3 0.2 - 1.2 mg/dL   Alkaline Phosphatase 91 39 - 117 U/L   AST 18 0 - 37 U/L   ALT 25 0 - 35 U/L   Total Protein 6.6 6.0 - 8.3 g/dL   Albumin 3.9 3.5 - 5.2 g/dL   Calcium 9.4 8.4 - 10.5 mg/dL   GFR 97.14 >60.00 mL/min  Lipid panel  Result Value Ref Range   Cholesterol 142 0 - 200 mg/dL   Triglycerides 74.0 0.0 - 149.0 mg/dL   HDL 45.80 >39.00 mg/dL   VLDL 14.8 0.0 - 40.0 mg/dL   LDL Cholesterol 82 0 - 99 mg/dL   Total CHOL/HDL Ratio 3    NonHDL 96.66   TSH  Result Value Ref Range   TSH 1.91 0.35 - 4.50 uIU/mL  Hemoglobin A1c  Result Value Ref Range   Hgb  A1c MFr Bld 5.2 4.6 - 6.5 %    Assessment and Plan:   [name] was seen today for establish care.  Diagnoses and all orders for this visit:  Attention deficit hyperactivity disorder (ADHD), unspecified ADHD type -     amphetamine-dextroamphetamine (ADDERALL XR) 20 MG 24 hr capsule; Take 1 capsule (20 mg total) by mouth  every morning. -     amphetamine-dextroamphetamine (ADDERALL XR) 20 MG 24 hr capsule; Take 1 capsule (20 mg total) by mouth every morning. -     amphetamine-dextroamphetamine (ADDERALL XR) 20 MG 24 hr capsule; Take 1 capsule (20 mg total) by mouth every morning.  Morbid obesity ([place]) -     CBC with Differential/Platelet -     Comprehensive metabolic panel -     TSH -     Hemoglobin A1c  Essential hypertension -     CBC with Differential/Platelet -     Comprehensive metabolic panel -     amLODipine (NORVASC) 5 MG tablet; Take 1 tablet (5 mg total) by mouth daily.  Lipid screening -     Lipid panel  Sleep-disordered breathing -     Ambulatory referral to Sleep Studies    . Reviewed expectations re: course of current medical issues. . Discussed self-management of symptoms. . Outlined signs and symptoms indicating need for more acute intervention. . Patient verbalized understanding and all questions were answered. Marland Kitchen Health Maintenance issues including appropriate healthy diet, exercise, and smoking avoidance were discussed with patient. . See orders for this visit as documented in the electronic medical record. . Patient received an After Visit Summary.  Patient will follow up in 1-2 weeks for CPE with PAP. Labs to be reviewed at that time.   Briscoe Deutscher, DO [place], [facility] 10/15/2017

## 2017-10-14 ENCOUNTER — Encounter: Payer: Self-pay | Admitting: Family Medicine

## 2017-10-14 ENCOUNTER — Encounter: Payer: Self-pay | Admitting: Surgical

## 2017-10-14 ENCOUNTER — Ambulatory Visit: Payer: 59 | Admitting: Family Medicine

## 2017-10-14 VITALS — BP 130/98 | HR 89 | Temp 98.4°F | Ht 66.0 in | Wt 305.4 lb

## 2017-10-14 DIAGNOSIS — F909 Attention-deficit hyperactivity disorder, unspecified type: Secondary | ICD-10-CM | POA: Diagnosis not present

## 2017-10-14 DIAGNOSIS — I10 Essential (primary) hypertension: Secondary | ICD-10-CM

## 2017-10-14 DIAGNOSIS — Z79899 Other long term (current) drug therapy: Secondary | ICD-10-CM

## 2017-10-14 DIAGNOSIS — Z13220 Encounter for screening for lipoid disorders: Secondary | ICD-10-CM | POA: Diagnosis not present

## 2017-10-14 DIAGNOSIS — G4730 Sleep apnea, unspecified: Secondary | ICD-10-CM | POA: Diagnosis not present

## 2017-10-14 LAB — COMPREHENSIVE METABOLIC PANEL
ALT: 25 U/L (ref 0–35)
AST: 18 U/L (ref 0–37)
Albumin: 3.9 g/dL (ref 3.5–5.2)
Alkaline Phosphatase: 91 U/L (ref 39–117)
BUN: 17 mg/dL (ref 6–23)
CO2: 27 mEq/L (ref 19–32)
Calcium: 9.4 mg/dL (ref 8.4–10.5)
Chloride: 105 mEq/L (ref 96–112)
Creatinine, Ser: 0.74 mg/dL (ref 0.40–1.20)
GFR: 97.14 mL/min (ref >60.00)
Glucose, Bld: 80 mg/dL (ref 70–99)
Potassium: 4.1 mEq/L (ref 3.5–5.1)
Sodium: 139 mEq/L (ref 135–145)
Total Bilirubin: 0.3 mg/dL (ref 0.2–1.2)
Total Protein: 6.6 g/dL (ref 6.0–8.3)

## 2017-10-14 LAB — LIPID PANEL
Cholesterol: 142 mg/dL (ref 0–200)
HDL: 45.8 mg/dL (ref >39.00)
LDL Cholesterol: 82 mg/dL (ref 0–99)
NonHDL: 96.66
Total CHOL/HDL Ratio: 3
Triglycerides: 74 mg/dL (ref 0.0–149.0)
VLDL: 14.8 mg/dL (ref 0.0–40.0)

## 2017-10-14 LAB — CBC WITH DIFFERENTIAL/PLATELET
Basophils Absolute: 0 10*3/uL (ref 0.0–0.1)
Basophils Relative: 0.2 % (ref 0.0–3.0)
Eosinophils Absolute: 0.6 10*3/uL (ref 0.0–0.7)
Eosinophils Relative: 6.3 % — ABNORMAL HIGH (ref 0.0–5.0)
HCT: 38.2 % (ref 36.0–46.0)
Hemoglobin: 13.1 g/dL (ref 12.0–15.0)
Lymphocytes Relative: 16.7 % (ref 12.0–46.0)
Lymphs Abs: 1.5 10*3/uL (ref 0.7–4.0)
MCHC: 34.4 g/dL (ref 30.0–36.0)
MCV: 79 fl (ref 78.0–100.0)
Monocytes Absolute: 0.5 10*3/uL (ref 0.1–1.0)
Monocytes Relative: 5.6 % (ref 3.0–12.0)
Neutro Abs: 6.4 10*3/uL (ref 1.4–7.7)
Neutrophils Relative %: 71.2 % (ref 43.0–77.0)
Platelets: 186 10*3/uL (ref 150.0–400.0)
RBC: 4.84 Mil/uL (ref 3.87–5.11)
RDW: 14.1 % (ref 11.5–15.5)
WBC: 9.1 10*3/uL (ref 4.0–10.5)

## 2017-10-14 LAB — TSH: TSH: 1.91 u[IU]/mL (ref 0.35–4.50)

## 2017-10-14 LAB — HEMOGLOBIN A1C: Hgb A1c MFr Bld: 5.2 % (ref 4.6–6.5)

## 2017-10-14 MED ORDER — AMLODIPINE BESYLATE 5 MG PO TABS: 5.0000 mg | ORAL_TABLET | Freq: Every day | ORAL | 0 refills | Status: DC

## 2017-10-14 MED ORDER — AMPHETAMINE-DEXTROAMPHET ER 20 MG PO CP24: 20.0000 mg | ORAL_CAPSULE | ORAL | 0 refills | Status: DC

## 2017-10-15 ENCOUNTER — Encounter: Payer: Self-pay | Admitting: Family Medicine

## 2017-10-15 DIAGNOSIS — F909 Attention-deficit hyperactivity disorder, unspecified type: Secondary | ICD-10-CM | POA: Insufficient documentation

## 2017-10-15 DIAGNOSIS — I10 Essential (primary) hypertension: Secondary | ICD-10-CM | POA: Insufficient documentation

## 2017-10-29 ENCOUNTER — Other Ambulatory Visit (HOSPITAL_COMMUNITY)
Admission: RE | Admit: 2017-10-29 | Discharge: 2017-10-29 | Disposition: A | Discharge status: Home / Self Care | Payer: 59 | Source: Ambulatory Visit | Attending: Family Medicine | Admitting: Family Medicine

## 2017-10-29 ENCOUNTER — Ambulatory Visit (INDEPENDENT_AMBULATORY_CARE_PROVIDER_SITE_OTHER): Payer: 59 | Admitting: Family Medicine

## 2017-10-29 ENCOUNTER — Encounter: Payer: Self-pay | Admitting: Family Medicine

## 2017-10-29 VITALS — BP 122/88 | HR 81 | Temp 98.1°F | Ht 66.5 in | Wt 304.4 lb

## 2017-10-29 DIAGNOSIS — Z114 Encounter for screening for human immunodeficiency virus [HIV]: Secondary | ICD-10-CM | POA: Diagnosis not present

## 2017-10-29 DIAGNOSIS — E88819 Insulin resistance, unspecified: Secondary | ICD-10-CM

## 2017-10-29 DIAGNOSIS — R739 Hyperglycemia, unspecified: Secondary | ICD-10-CM

## 2017-10-29 DIAGNOSIS — Z0000 Encounter for general adult medical examination without abnormal findings: Secondary | ICD-10-CM | POA: Diagnosis not present

## 2017-10-29 DIAGNOSIS — Z124 Encounter for screening for malignant neoplasm of cervix: Secondary | ICD-10-CM

## 2017-10-29 DIAGNOSIS — E8881 Metabolic syndrome: Secondary | ICD-10-CM

## 2017-10-29 NOTE — Progress Notes (Signed)
Subjective:    Laura Werner is a 32 y.o. female and is here for a comprehensive physical exam.  Current Outpatient Medications:  .  amLODipine (NORVASC) 5 MG tablet, Take 1 tablet (5 mg total) by mouth daily., Disp: 30 tablet, Rfl: 0 .  amphetamine-dextroamphetamine (ADDERALL XR) 20 MG 24 hr capsule, Take 1 capsule (20 mg total) by mouth every morning., Disp: 30 capsule, Rfl: 0 .  [START ON 12/14/2017] amphetamine-dextroamphetamine (ADDERALL XR) 20 MG 24 hr capsule, Take 1 capsule (20 mg total) by mouth every morning., Disp: 30 capsule, Rfl: 0 .  [START ON 11/13/2017] amphetamine-dextroamphetamine (ADDERALL XR) 20 MG 24 hr capsule, Take 1 capsule (20 mg total) by mouth every morning., Disp: 30 capsule, Rfl: 0  Health Maintenance Due  Topic Date Due  . HIV Screening  07/21/2001  . PAP SMEAR  07/22/2007   PMHx, SurgHx, SocialHx, Medications, and Allergies were reviewed in the Visit Navigator and updated as appropriate.   Past Medical History:  Diagnosis Date  . ADHD (attention deficit hyperactivity disorder), combined type   . Benign joint hypermobility   . HTN (hypertension)    Past Surgical History:  Procedure Laterality Date  . ARTHROSCOPIC REPAIR ACL  2018               Paternal Grandmother   . Lung cancer Paternal Grandfather   . Cancer Mother   . Depression Mother   . Hypertension Mother   . Alcohol abuse Father   . Hypertension Other    Social History   Tobacco Use  . Smoking status: Never Smoker  . Smokeless tobacco: Never Used  Substance Use Topics  . Alcohol use: Yes    Comment: 1-2 drinks/week or less  . Drug use: No    Review of Systems:   Pertinent items are noted in the HPI. Otherwise, ROS is negative.  Objective:   BP 122/88 (BP Location: Left Arm, Patient Position: Sitting, Cuff Size: Large)   Pulse 81   Temp 98.1 F (36.7 C) (Oral)   Ht 5' 6.5" (1.689 m)   Wt (!) 304 lb 6.4 oz (138.1 kg)   LMP 10/21/2017 (Exact Date)   SpO2 98%    BMI 48.40 kg/m     General appearance: alert, cooperative and appears stated age. Head: normocephalic, without obvious abnormality, atraumatic. Neck: no adenopathy, supple, symmetrical, trachea midline; thyroid not enlarged, symmetric, no tenderness/mass/nodules. Lungs: clear to auscultation bilaterally. Heart: regular rate and rhythm Abdomen: soft, non-tender; no masses,  no organomegaly. Extremities: extremities normal, atraumatic, no cyanosis or edema. Skin: skin color, texture, turgor normal, no rashes or lesions. Lymph: cervical, supraclavicular, and axillary nodes normal; no abnormal inguinal nodes palpated. Neurologic: grossly normal.  Pelvic:  External genitalia: no lesions.              Urethra: normal appearing urethra with no masses, tenderness or lesions.              Bartholins and Skenes: normal.               Vagina: normal appearing vagina with normal color and discharge, no lesions.              Cervix: normal appearance.              Pap and high risk HPV testing done: Yes.          Bimanual Exam:   Uterus: uterus is normal size, shape, consistency and nontender.  Adnexa: normal adnexa in size, nontender and no masses.  Assessment/Plan:   Laura Werner was seen today for annual exam.  Diagnoses and all orders for this visit:  Routine physical examination  Morbid obesity ([place])  Pap smear for cervical cancer screening    Patient Counseling:   [x]     Nutrition: Stressed importance of moderation in sodium/caffeine intake, saturated fat and cholesterol, caloric balance, sufficient intake of fresh fruits, vegetables, fiber, calcium, iron, and 1 mg of folate supplement per day (for females capable of pregnancy).   [x]      Stressed the importance of regular exercise.    [x]     Substance Abuse: Discussed cessation/primary prevention of tobacco, alcohol, or other drug use; driving or other dangerous activities under the influence;  availability of treatment for abuse.    [x]      Injury prevention: Discussed safety belts, safety helmets, smoke detector, smoking near bedding or upholstery.    [x]      Sexuality: Discussed sexually transmitted diseases, partner selection, use of condoms, avoidance of unintended pregnancy  and contraceptive alternatives.    [x]     Dental health: Discussed importance of regular tooth brushing, flossing, and dental visits.   [x]      Health maintenance and immunizations reviewed. Please refer to Health maintenance section.   Briscoe Deutscher, DO [place]

## 2017-10-29 NOTE — Patient Instructions (Addendum)
They have been calling to make an appointment for you with the sleep study. Call when you get back from vacation.  SLEEP MAIN PHONE: [phone] [place] PHONE: [phone]

## 2017-10-30 LAB — CYTOLOGY - PAP
Adequacy: ABSENT
Bacterial vaginitis: NEGATIVE
Candida vaginitis: NEGATIVE
Chlamydia: NEGATIVE
Diagnosis: NEGATIVE
HPV: NOT DETECTED
Neisseria Gonorrhea: NEGATIVE
Trichomonas: NEGATIVE

## 2017-11-02 LAB — INSULIN, FREE (BIOACTIVE): Insulin, Free: 16.7 u[IU]/mL — ABNORMAL HIGH (ref 1.5–14.9)

## 2017-11-02 LAB — HIV ANTIBODY (ROUTINE TESTING W REFLEX): HIV 1&2 Ab, 4th Generation: NONREACTIVE

## 2017-11-04 ENCOUNTER — Encounter: Payer: Self-pay | Admitting: Family Medicine

## 2017-11-04 DIAGNOSIS — E8881 Metabolic syndrome: Secondary | ICD-10-CM | POA: Insufficient documentation

## 2017-11-04 DIAGNOSIS — E88819 Insulin resistance, unspecified: Secondary | ICD-10-CM | POA: Insufficient documentation

## 2017-11-04 MED ORDER — METFORMIN HCL ER 750 MG PO TB24: 750.0000 mg | ORAL_TABLET | Freq: Every day | ORAL | 2 refills | Status: DC

## 2017-11-04 NOTE — Addendum Note (Signed)
Addended by: Briscoe Deutscher R on: 11/04/2017 07:01 AM   Modules accepted: Orders

## 2017-11-06 ENCOUNTER — Ambulatory Visit: Payer: 59 | Admitting: Family Medicine

## 2017-11-09 ENCOUNTER — Other Ambulatory Visit: Payer: Self-pay | Admitting: Family Medicine

## 2017-11-09 DIAGNOSIS — I10 Essential (primary) hypertension: Secondary | ICD-10-CM

## 2017-11-20 ENCOUNTER — Encounter: Payer: Self-pay | Admitting: Family Medicine

## 2017-11-20 ENCOUNTER — Ambulatory Visit (INDEPENDENT_AMBULATORY_CARE_PROVIDER_SITE_OTHER): Payer: 59 | Admitting: Family Medicine

## 2017-11-20 VITALS — BP 114/76 | HR 84 | Temp 97.9°F | Ht 66.5 in | Wt 305.8 lb

## 2017-11-20 DIAGNOSIS — E8881 Metabolic syndrome: Secondary | ICD-10-CM

## 2017-11-20 DIAGNOSIS — D229 Melanocytic nevi, unspecified: Secondary | ICD-10-CM

## 2017-11-20 DIAGNOSIS — E88819 Insulin resistance, unspecified: Secondary | ICD-10-CM

## 2017-11-20 MED ORDER — METFORMIN HCL ER 750 MG PO TB24: 750.0000 mg | ORAL_TABLET | Freq: Every day | ORAL | 11 refills | Status: DC

## 2017-11-20 NOTE — Progress Notes (Signed)
Laura Werner is a 33 y.o. female is here for follow up.  History of Present Illness:   HPI: Patient presents today to discuss starting Metformin for insulin resistance. Free insulin was elevated at 16.7 at her last visit. She wants to make sure that this will not cause her to progress to diabetes.   There are no preventive care reminders to display for this patient.   Depression screen [facility] 2/9 10/29/2017 10/14/2017  Decreased Interest 2 0  Down, Depressed, Hopeless 0 0  PHQ - 2 Score 2 0  Altered sleeping 0 -  Tired, decreased energy 0 -  Change in appetite 0 -  Feeling bad or failure about yourself  0 -  Trouble concentrating 0 -  Moving slowly or fidgety/restless 0 -  Suicidal thoughts 0 -  PHQ-9 Score 2 -  Difficult doing work/chores Not difficult at all -   PMHx, SurgHx, SocialHx, FamHx, Medications, and Allergies were reviewed in the Visit Navigator and updated as appropriate.   Patient Active Problem List   Diagnosis Date Noted  . Insulin resistance 11/04/2017  . Attention deficit hyperactivity disorder (ADHD) 10/15/2017  . Essential hypertension 10/15/2017  . Morbid obesity ([place]) 02/12/2013   Social History   Tobacco Use  . Smoking status: Never Smoker  . Smokeless tobacco: Never Used  Substance Use Topics  . Alcohol use: Yes    Comment: 1-2 drinks/week or less  . Drug use: No   Current Medications and Allergies:   Current Outpatient Medications:  .  amLODipine (NORVASC) 5 MG tablet, TAKE 1 TABLET(5 MG) BY MOUTH DAILY, Disp: 30 tablet, Rfl: 6 .  [START ON 12/14/2017] amphetamine-dextroamphetamine (ADDERALL XR) 20 MG 24 hr capsule, Take 1 capsule (20 mg total) by mouth every morning., Disp: 30 capsule, Rfl: 0 .  amphetamine-dextroamphetamine (ADDERALL XR) 20 MG 24 hr capsule, Take 1 capsule (20 mg total) by mouth every morning., Disp: 30 capsule, Rfl: 0 .  amphetamine-dextroamphetamine (ADDERALL XR) 20 MG 24 hr capsule, Take 1 capsule (20 mg total) by  mouth every morning., Disp: 30 capsule, Rfl: 0 .  metFORMIN (GLUCOPHAGE XR) 750 MG 24 hr tablet, Take 1 tablet (750 mg total) by mouth daily with breakfast., Disp: 30 tablet, Rfl: 11  No Known Allergies Review of Systems   Pertinent items are noted in the HPI. Otherwise, ROS is negative.  Vitals:   Vitals:   11/20/17 0748  BP: 114/76  Pulse: 84  Temp: 97.9 F (36.6 C)  TempSrc: Oral  SpO2: 98%  Weight: (!) 305 lb 12.8 oz (138.7 kg)  Height: 5' 6.5" (1.689 m)     Body mass index is 48.62 kg/m.  Physical Exam:   Physical Exam  Constitutional: She appears well-nourished.  HENT:  Head: Normocephalic and atraumatic.  Eyes: Pupils are equal, round, and reactive to light. EOM are normal.    Neck: Normal range of motion. Neck supple.  Cardiovascular: Normal rate, regular rhythm, normal heart sounds and intact distal pulses.  Pulmonary/Chest: Effort normal.  Abdominal: Soft.  Skin: Skin is warm.  Psychiatric: She has a normal mood and affect. Her behavior is normal.  Nursing note and vitals reviewed.  Results for orders placed or performed in visit on 10/29/17  HIV antibody  Result Value Ref Range   HIV 1&2 Ab, 4th Generation NON-REACTIVE NON-REACTI  Insulin, Free (Bioactive)  Result Value Ref Range   Insulin, Free 16.7 (H) 1.5 - 14.9 uIU/mL  Cytology - PAP  Result Value Ref Range  Adequacy      Satisfactory for evaluation  endocervical/transformation zone component ABSENT.   Diagnosis      NEGATIVE FOR INTRAEPITHELIAL LESIONS OR MALIGNANCY.   Bacterial vaginitis Negative for Bacterial Vaginitis Microorganisms    Candida vaginitis Negative for Candida species    Chlamydia Negative    Neisseria gonorrhea Negative    Trichomonas Negative    HPV NOT DETECTED    Material Submitted CervicoVaginal Pap [ThinPrep Imaged]     Assessment and Plan:   Diagnoses and all orders for this visit:  Insulin resistance -     metFORMIN (GLUCOPHAGE XR) 750 MG 24 hr tablet; Take  1 tablet (750 mg total) by mouth daily with breakfast. -     Ambulatory referral to Ophthalmology  Morbid obesity ([place])  Benign mole -     Ambulatory referral to Ophthalmology    . Reviewed expectations re: course of current medical issues. . Discussed self-management of symptoms. . Outlined signs and symptoms indicating need for more acute intervention. . Patient verbalized understanding and all questions were answered. Marland Kitchen Health Maintenance issues including appropriate healthy diet, exercise, and smoking avoidance were discussed with patient. . See orders for this visit as documented in the electronic medical record. . Patient received an After Visit Summary.  Briscoe Deutscher, DO [place], [place] 11/20/2017  No future appointments.

## 2017-12-07 ENCOUNTER — Other Ambulatory Visit: Payer: Self-pay | Admitting: Family Medicine

## 2017-12-07 DIAGNOSIS — F909 Attention-deficit hyperactivity disorder, unspecified type: Secondary | ICD-10-CM

## 2017-12-09 NOTE — Telephone Encounter (Signed)
Left message for patient that she should have a printed copy of the prescription from last visit. I told her to give use a call if she does not.

## 2017-12-13 ENCOUNTER — Encounter: Payer: Self-pay | Admitting: Family Medicine

## 2018-01-02 DIAGNOSIS — Z0100 Encounter for examination of eyes and vision without abnormal findings: Secondary | ICD-10-CM | POA: Diagnosis not present

## 2018-01-21 ENCOUNTER — Encounter: Payer: Self-pay | Admitting: Family Medicine

## 2018-01-21 DIAGNOSIS — F909 Attention-deficit hyperactivity disorder, unspecified type: Secondary | ICD-10-CM

## 2018-01-21 MED ORDER — AMPHETAMINE-DEXTROAMPHET ER 20 MG PO CP24: 20.0000 mg | ORAL_CAPSULE | ORAL | 0 refills | Status: DC

## 2018-01-21 NOTE — Telephone Encounter (Signed)
Will need to send message to patient to make follow up for 11/19 scripts pending

## 2018-02-05 NOTE — Telephone Encounter (Signed)
Pt called in for RX stating she only has 5 days on hand now. Appt scheduled for 03/19/18 7:20am as pt is not available to come in 03/18/18. Pt requesting RX be sent to updated pharmacy as she has moved.  [facility] [place], [place] AT [phone] (Phone) [phone] (Fax)

## 2018-02-05 NOTE — Telephone Encounter (Signed)
See note

## 2018-02-06 MED ORDER — AMPHETAMINE-DEXTROAMPHET ER 20 MG PO CP24: 20.0000 mg | ORAL_CAPSULE | ORAL | 0 refills | Status: DC

## 2018-02-06 NOTE — Addendum Note (Signed)
Addended by: Briscoe Deutscher R on: 02/06/2018 06:49 PM   Modules accepted: Orders

## 2018-03-18 NOTE — Progress Notes (Signed)
Laura Werner is a 32 y.o. female is here for follow up.  History of Present Illness:   Laura Werner, CMA acting as scribe for Dr. Briscoe Werner.   HPI:   1. Essential hypertension. Review: taking medications as instructed, no medication side effects noted, no TIAs, no chest pain on exertion, no dyspnea on exertion, no swelling of ankles. Smoker: No..   2. Insulin resistance.   Current symptoms: She did not start meformin.  Weight trend: fluctuating a bit Prior visit with dietician: no Current diet: in general, a "healthy" diet   Current exercise: none   3. Attention deficit hyperactivity disorder (ADHD), unspecified ADHD type. Since the last visit has the patient had any:  Appetite changes? No Unintentional weight loss? No Is medication working well ? Yes Does patient take drug holidays? No Difficulties falling to sleep or maintaining sleep? No Any anxiety?  No Any cardiac issues (fainting or paliptations)? No Suicidal thoughts? No Changes in health since last visit? No New medications? No Any illicit substance abuse? No Has the patient taken his medication today? Yes    Depression screen [facility] 2/9 10/29/2017 10/14/2017  Decreased Interest 2 0  Down, Depressed, Hopeless 0 0  PHQ - 2 Score 2 0  Altered sleeping 0 -  Tired, decreased energy 0 -  Change in appetite 0 -  Feeling bad or failure about yourself  0 -  Trouble concentrating 0 -  Moving slowly or fidgety/restless 0 -  Suicidal thoughts 0 -  PHQ-9 Score 2 -  Difficult doing work/chores Not difficult at all -   PMHx, SurgHx, SocialHx, FamHx, Medications, and Allergies were reviewed in the Visit Navigator and updated as appropriate.   Patient Active Problem List   Diagnosis Date Noted  . Insulin resistance 11/04/2017  . Attention deficit hyperactivity disorder (ADHD) 10/15/2017  . Essential hypertension 10/15/2017  . Morbid obesity ([place]) 02/12/2013   Social History   Tobacco Use  . Smoking  status: Never Smoker  . Smokeless tobacco: Never Used  Substance Use Topics  . Alcohol use: Yes    Comment: 1-2 drinks/week or less  . Drug use: No   Current Medications and Allergies:   .  amLODipine (NORVASC) 5 MG tablet, TAKE 1 TABLET(5 MG) BY MOUTH DAILY, Disp: 30 tablet, Rfl: 6 .  amphetamine-dextroamphetamine (ADDERALL XR) 20 MG 24 hr capsule, Take 1 capsule (20 mg total) by mouth every morning., Disp: 30 capsule, Rfl: 0 .  metFORMIN (GLUCOPHAGE XR) 750 MG 24 hr tablet, Take 1 tablet (750 mg total) by mouth daily with breakfast., Disp: 30 tablet, Rfl: 11  No Known Allergies   Review of Systems   Pertinent items are noted in the HPI. Otherwise, ROS is negative.  Vitals:   Vitals:   03/19/18 0718  BP: 126/74  Pulse: 84  Temp: 98.6 F (37 C)  TempSrc: Oral  SpO2: 98%  Weight: (!) 307 lb 12.8 oz (139.6 kg)  Height: 5' 6.5" (1.689 m)     Body mass index is 48.94 kg/m.  Physical Exam:   Physical Exam  Constitutional: She appears well-nourished.  HENT:  Head: Normocephalic and atraumatic.  Eyes: Pupils are equal, round, and reactive to light. EOM are normal.  Neck: Normal range of motion. Neck supple.  Cardiovascular: Normal rate, regular rhythm, normal heart sounds and intact distal pulses.  Pulmonary/Chest: Effort normal.  Abdominal: Soft.  Skin: Skin is warm.  Psychiatric: She has a normal mood and affect. Her behavior is normal.  Nursing note and vitals reviewed.  Assessment and Plan:   [name] was seen today for follow-up.  Diagnoses and all orders for this visit:  Essential hypertension Comments: Controlled. Continue current treatment.  Insulin resistance -     metFORMIN (GLUCOPHAGE XR) 750 MG 24 hr tablet; Take 1 tablet (750 mg total) by mouth daily with breakfast.  Morbid obesity ([place]) Comments: Reviewed medication and surgical options.  Attention deficit hyperactivity disorder (ADHD), unspecified ADHD type -     amphetamine-dextroamphetamine  (ADDERALL XR) 20 MG 24 hr capsule; Take 1 capsule (20 mg total) by mouth every morning. -     amphetamine-dextroamphetamine (ADDERALL XR) 20 MG 24 hr capsule; Take 1 capsule (20 mg total) by mouth daily. -     amphetamine-dextroamphetamine (ADDERALL XR) 20 MG 24 hr capsule; Take 1 capsule (20 mg total) by mouth daily.   . Reviewed expectations re: course of current medical issues. . Discussed self-management of symptoms. . Outlined signs and symptoms indicating need for more acute intervention. . Patient verbalized understanding and all questions were answered. Marland Kitchen Health Maintenance issues including appropriate healthy diet, exercise, and smoking avoidance were discussed with patient. . See orders for this visit as documented in the electronic medical record. . Patient received an After Visit Summary.  CMA served as Education administrator during this visit. History, Physical, and Plan performed by medical provider. The above documentation has been reviewed and is accurate and complete. Laura Werner, D.O.  Laura Deutscher, DO [place], [facility] 03/19/2018

## 2018-03-19 ENCOUNTER — Encounter: Payer: Self-pay | Admitting: Family Medicine

## 2018-03-19 ENCOUNTER — Ambulatory Visit (INDEPENDENT_AMBULATORY_CARE_PROVIDER_SITE_OTHER): Payer: 59 | Admitting: Family Medicine

## 2018-03-19 VITALS — BP 126/74 | HR 84 | Temp 98.6°F | Ht 66.5 in | Wt 307.8 lb

## 2018-03-19 DIAGNOSIS — F909 Attention-deficit hyperactivity disorder, unspecified type: Secondary | ICD-10-CM | POA: Diagnosis not present

## 2018-03-19 DIAGNOSIS — I10 Essential (primary) hypertension: Secondary | ICD-10-CM

## 2018-03-19 DIAGNOSIS — E8881 Metabolic syndrome: Secondary | ICD-10-CM | POA: Diagnosis not present

## 2018-03-19 DIAGNOSIS — E88819 Insulin resistance, unspecified: Secondary | ICD-10-CM

## 2018-03-19 MED ORDER — AMPHETAMINE-DEXTROAMPHET ER 20 MG PO CP24: 20.0000 mg | ORAL_CAPSULE | ORAL | 0 refills | Status: DC

## 2018-03-19 MED ORDER — AMPHETAMINE-DEXTROAMPHET ER 20 MG PO CP24: 20.0000 mg | ORAL_CAPSULE | Freq: Every day | ORAL | 0 refills | Status: DC

## 2018-03-19 MED ORDER — METFORMIN HCL ER 750 MG PO TB24: 750.0000 mg | ORAL_TABLET | Freq: Every day | ORAL | 11 refills | Status: DC

## 2018-06-19 ENCOUNTER — Ambulatory Visit: Payer: 59 | Admitting: Physician Assistant

## 2018-06-20 ENCOUNTER — Encounter: Payer: Self-pay | Admitting: Physician Assistant

## 2018-06-20 ENCOUNTER — Ambulatory Visit: Payer: 59 | Admitting: Family Medicine

## 2018-07-05 ENCOUNTER — Other Ambulatory Visit: Payer: Self-pay | Admitting: Family Medicine

## 2018-07-05 DIAGNOSIS — I10 Essential (primary) hypertension: Secondary | ICD-10-CM

## 2018-07-28 ENCOUNTER — Encounter: Payer: Self-pay | Admitting: Family Medicine

## 2018-08-02 ENCOUNTER — Other Ambulatory Visit: Payer: Self-pay | Admitting: Family Medicine

## 2018-08-02 DIAGNOSIS — I10 Essential (primary) hypertension: Secondary | ICD-10-CM

## 2018-09-05 ENCOUNTER — Telehealth: Payer: Self-pay | Admitting: Family Medicine

## 2018-09-05 NOTE — Telephone Encounter (Signed)
MyChart visit has been scheduled.

## 2018-09-05 NOTE — Telephone Encounter (Signed)
Patient needs to be scheduled for 09/09/18 @ 340 for a med refill. Mychart Virtual visit. Ok per [employer]

## 2018-09-08 NOTE — Patient Instructions (Signed)
Hand Washing Germs such as bacteria, viruses, and parasites are found everywhere. They can be in the air and water. They can also be on surfaces like food, door handles, and your skin. Every day, your hands touch germs. Many of these germs can make you and your family sick. Washing your hands is one of the best ways to lower your risk of getting and sharing germs. When should I wash my hands? You should wash your hands whenever you think they are dirty. You should also wash your hands:  Before: ? Visiting a baby or anyone with a weakened disease-fighting system (immunesystem). ? Putting in and taking out contact lenses.  After: ? Using the bathroom or helping someone else use the bathroom. ? Working or playing outside. ? Touching or taking out the garbage. ? Touching anything dirty around your home. ? Sneezing, coughing, or blowing your nose. ? Using a phone, including your mobile phone. ? Touching an animal, animal food, animal poop, or its toys or leash. ? Touching money. ? Using household cleaners or poisonous chemicals. ? Handling dirty clothes, bedding, or rags. ? Using public transportation. ? Going shopping, especially if you use a shopping cart or basket. ? Shaking hands. ? Handling livestock, such as cows or sheep.  Before and after: ? Preparing food. ? Eating. ? Visiting or taking care of someone who is sick. This includes touching used tissues, toys, and clothes. ? Changing a bandage (dressing). ? Taking care of an injury or wound. ? Giving or taking medicine. ? Preparing a bottle for a baby. ? Feeding a baby or young child. ? Changing a diaper. What is the right way to wash my hands?  1. Wet your hands with clean, running water. Turn off the water or move your hands out of the running water. 2. Apply liquid soap or bar soap to your hands. 3. Rub your hands together quickly to create lather. 4. Keep rubbing your hands together for at least 20 seconds. Thoroughly  scrub all parts of your hands. This includes scrubbing under your fingernails and between your fingers. 5. Rinse your hands with clean, running water. Do this until all the soap is gone. 6. Dry your hands using an air dryer or a clean paper or cloth towel, or let your hands air-dry. Do not use your clothing or a dirty towel to dry your hands. If you are in a public restroom, use your towel:  To turn off the water faucet.  To open the bathroom door. How can I clean my hands if I do not have soap and water?  If soap and clean water are not available, use a hand-washing wipe, spray, or gel (hand sanitizer). Use one that contains at least 60% alcohol. If you are handling food, gels are not recommended as a replacement for hand washing with soap and water. To use these products, follow the directions on the product, and:  Apply enough product to cover your hands.  Make sure you wipe, rub, or spray the product so that it reaches every part of your hands and wrists. Include the backs of your hands, between your fingers, and under your fingernails.  Rub the product onto your hands until it dries. Summary  Every day, your hands touch germs. Many of these germs can make you and your family sick.  Washing your hands is one of the best ways to lower your risk of getting and sharing germs.  If soap and clean water are not available,   use a hand-washing wipe, spray, or gel. This information is not intended to replace advice given to you by your health care provider. Make sure you discuss any questions you have with your health care provider. Document Released: 03/29/2008 Document Revised: 01/23/2017 Document Reviewed: 01/23/2017 Elsevier Interactive Patient Education  2019 [employer]  

## 2018-09-08 NOTE — Progress Notes (Signed)
Virtual Visit via Video   Due to the COVID-19 pandemic, this visit was completed with telemedicine (audio/video) technology to reduce patient and provider exposure as well as to preserve personal protective equipment.   I connected with Laura Werner by a video enabled telemedicine application and verified that I am speaking with the correct person using two identifiers. Location patient: Home Location provider: [facility], Office Persons participating in the virtual visit: Laura Werner, Laura Deutscher, DO Laura Werner, CMA acting as scribe for Dr. Briscoe Werner.   I discussed the limitations of evaluation and management by telemedicine and the availability of in person appointments. The patient expressed understanding and agreed to proceed.  Care Team   Patient Care Team: Laura Deutscher, DO as PCP - General (Family Medicine)  Subjective:   HPI:   Essential hypertension Review: taking medications as instructed, no medication side effects noted, no TIAs, no chest pain on exertion, no dyspnea on exertion, no swelling of ankles. Smoker: No..  BP Readings from Last 3 Encounters:  03/19/18 126/74  11/20/17 114/76  10/29/17 122/88   Lab Results  Component Value Date   CREATININE 0.74 10/14/2017   CREATININE 0.71 05/21/2012   CREATININE 0.85 04/30/2010     Insulin resistance Current symptoms: She did not start meformin.  Weight trend: fluctuating a bit Prior visit with dietician: no Current diet: in general, a "healthy" diet   Current exercise: none  Attention deficit hyperactivity disorder (ADHD), unspecified ADHD type Since the last visit has the patient had any:  Appetite changes? No Unintentional weight loss? No Is medication working well ? Yes Does patient take drug holidays? No Difficulties falling to sleep or maintaining sleep? No Any anxiety?  No Any cardiac issues (fainting or paliptations)? No Suicidal thoughts? No Changes in health  since last visit? No New medications? No Any illicit substance abuse? No Has the patient taken her medication today? Yes  Patient Active Problem List   Diagnosis Date Noted  . Insulin resistance 11/04/2017  . Attention deficit hyperactivity disorder (ADHD) 10/15/2017  . Essential hypertension 10/15/2017  . Morbid obesity ([place]) 02/12/2013    Social History   Tobacco Use  . Smoking status: Never Smoker  . Smokeless tobacco: Never Used  Substance Use Topics  . Alcohol use: Yes    Comment: 1-2 drinks/week or less    Current Outpatient Medications:  .  amLODipine (NORVASC) 5 MG tablet, TAKE 1 TABLET BY MOUTH EVERY DAY, Disp: 30 tablet, Rfl: 2 .  amphetamine-dextroamphetamine (ADDERALL XR) 20 MG 24 hr capsule, Take 1 capsule (20 mg total) by mouth daily., Disp: 30 capsule, Rfl: 0 .  [START ON 11/07/2018] amphetamine-dextroamphetamine (ADDERALL XR) 20 MG 24 hr capsule, Take 1 capsule (20 mg total) by mouth daily., Disp: 30 capsule, Rfl: 0 .  [START ON 10/08/2018] amphetamine-dextroamphetamine (ADDERALL XR) 20 MG 24 hr capsule, Take 1 capsule (20 mg total) by mouth daily., Disp: 30 capsule, Rfl: 0 .  amphetamine-dextroamphetamine (ADDERALL XR) 20 MG 24 hr capsule, Take 1 capsule (20 mg total) by mouth daily., Disp: 30 capsule, Rfl: 0  No Known Allergies  Objective:   VITALS: Per patient if applicable, see vitals. GENERAL: Alert, appears well and in no acute distress. HEENT: Atraumatic, conjunctiva clear, no obvious abnormalities on inspection of external nose and ears. NECK: Normal movements of the head and neck. CARDIOPULMONARY: No increased WOB. Speaking in clear sentences. I:E ratio WNL.  MS: Moves all visible extremities without noticeable abnormality. PSYCH: Pleasant and cooperative,  well-groomed. Speech normal rate and rhythm. Affect is appropriate. Insight and judgement are appropriate. Attention is focused, linear, and appropriate.  NEURO: CN grossly intact. Oriented as arrived  to appointment on time with no prompting. Moves both UE equally.  SKIN: No obvious lesions, wounds, erythema, or cyanosis noted on face or hands.  Depression screen [facility] 2/9 10/29/2017 10/14/2017  Decreased Interest 2 0  Down, Depressed, Hopeless 0 0  PHQ - 2 Score 2 0  Altered sleeping 0 -  Tired, decreased energy 0 -  Change in appetite 0 -  Feeling bad or failure about yourself  0 -  Trouble concentrating 0 -  Moving slowly or fidgety/restless 0 -  Suicidal thoughts 0 -  PHQ-9 Score 2 -  Difficult doing work/chores Not difficult at all -    Assessment and Plan:   Laura Werner was seen today for medication refill.  Diagnoses and all orders for this visit:  Essential hypertension  Insulin resistance  Attention deficit hyperactivity disorder (ADHD), unspecified ADHD type -     amphetamine-dextroamphetamine (ADDERALL XR) 20 MG 24 hr capsule; Take 1 capsule (20 mg total) by mouth daily. -     amphetamine-dextroamphetamine (ADDERALL XR) 20 MG 24 hr capsule; Take 1 capsule (20 mg total) by mouth daily. -     amphetamine-dextroamphetamine (ADDERALL XR) 20 MG 24 hr capsule; Take 1 capsule (20 mg total) by mouth daily.  Morbid obesity ([place])   . COVID-19 Education: The signs and symptoms of COVID-19 were discussed with the patient and how to seek care for testing if needed. The importance of social distancing was discussed today. . Reviewed expectations re: course of current medical issues. . Discussed self-management of symptoms. . Outlined signs and symptoms indicating need for more acute intervention. . Patient verbalized understanding and all questions were answered. Marland Kitchen Health Maintenance issues including appropriate healthy diet, exercise, and smoking avoidance were discussed with patient. . See orders for this visit as documented in the electronic medical record.  Laura Deutscher, DO  Records requested if needed. Time spent: 25 minutes, of which >50% was spent in obtaining information  about her symptoms, reviewing her previous labs, evaluations, and treatments, counseling her about her condition (please see the discussed topics above), and developing a plan to further investigate it; she had a number of questions which I addressed.

## 2018-09-09 ENCOUNTER — Other Ambulatory Visit: Payer: Self-pay

## 2018-09-09 ENCOUNTER — Ambulatory Visit (INDEPENDENT_AMBULATORY_CARE_PROVIDER_SITE_OTHER): Payer: 59 | Admitting: Family Medicine

## 2018-09-09 ENCOUNTER — Encounter: Payer: Self-pay | Admitting: Family Medicine

## 2018-09-09 VITALS — Ht 66.5 in | Wt 298.8 lb

## 2018-09-09 DIAGNOSIS — E8881 Metabolic syndrome: Secondary | ICD-10-CM

## 2018-09-09 DIAGNOSIS — F909 Attention-deficit hyperactivity disorder, unspecified type: Secondary | ICD-10-CM | POA: Diagnosis not present

## 2018-09-09 DIAGNOSIS — I10 Essential (primary) hypertension: Secondary | ICD-10-CM | POA: Diagnosis not present

## 2018-09-09 MED ORDER — AMPHETAMINE-DEXTROAMPHET ER 20 MG PO CP24: 20.0000 mg | ORAL_CAPSULE | Freq: Every day | ORAL | 0 refills | Status: DC

## 2019-01-22 ENCOUNTER — Encounter: Payer: Self-pay | Admitting: Family Medicine

## 2019-01-22 DIAGNOSIS — F909 Attention-deficit hyperactivity disorder, unspecified type: Secondary | ICD-10-CM

## 2019-01-22 MED ORDER — AMPHETAMINE-DEXTROAMPHET ER 20 MG PO CP24: 20.0000 mg | ORAL_CAPSULE | Freq: Every day | ORAL | 0 refills | Status: DC

## 2019-01-22 NOTE — Telephone Encounter (Signed)
Please see message and advise 

## 2019-02-16 ENCOUNTER — Other Ambulatory Visit: Payer: Self-pay

## 2019-02-16 DIAGNOSIS — Z20822 Contact with and (suspected) exposure to covid-19: Secondary | ICD-10-CM

## 2019-02-17 LAB — NOVEL CORONAVIRUS, NAA: SARS-CoV-2, NAA: NOT DETECTED

## 2019-02-20 ENCOUNTER — Other Ambulatory Visit: Payer: Self-pay

## 2019-02-20 DIAGNOSIS — I10 Essential (primary) hypertension: Secondary | ICD-10-CM

## 2019-02-20 MED ORDER — AMLODIPINE BESYLATE 5 MG PO TABS: 5.0000 mg | ORAL_TABLET | Freq: Every day | ORAL | 2 refills | Status: DC

## 2019-06-08 ENCOUNTER — Other Ambulatory Visit: Payer: Self-pay

## 2019-06-08 ENCOUNTER — Telehealth: Payer: Self-pay | Admitting: Family Medicine

## 2019-06-08 DIAGNOSIS — I10 Essential (primary) hypertension: Secondary | ICD-10-CM

## 2019-06-08 MED ORDER — AMLODIPINE BESYLATE 5 MG PO TABS: 5.0000 mg | ORAL_TABLET | Freq: Every day | ORAL | 3 refills | Status: DC

## 2019-06-08 NOTE — Telephone Encounter (Signed)
ERROR

## 2019-06-08 NOTE — Telephone Encounter (Signed)
  LAST APPOINTMENT DATE: 06/08/2019   NEXT APPOINTMENT DATE:@6 /25/2021- TOC  MEDICATION:amLODipine (NORVASC) 5 MG tablet  PHARMACY: [place], [place] [address] [facility]  Comments: Patient is completely out and has taken any medication in 2 days   **Let patient know to contact pharmacy at the end of the day to make sure medication is ready. **  ** Please notify patient to allow 48-72 hours to process**  **Encourage patient to contact the pharmacy for refills or they can request refills through [facility]**  CLINICAL FILLS OUT ALL BELOW:   LAST REFILL:  QTY:  REFILL DATE:    OTHER COMMENTS:    Okay for refill?  Please advise

## 2019-06-08 NOTE — Telephone Encounter (Signed)
Amlodipine sent to [facility].

## 2019-06-09 ENCOUNTER — Ambulatory Visit: Payer: 59 | Admitting: Family Medicine

## 2019-06-09 ENCOUNTER — Encounter: Payer: Self-pay | Admitting: Family Medicine

## 2019-06-09 ENCOUNTER — Other Ambulatory Visit: Payer: Self-pay

## 2019-06-09 DIAGNOSIS — F909 Attention-deficit hyperactivity disorder, unspecified type: Secondary | ICD-10-CM

## 2019-06-09 NOTE — Telephone Encounter (Signed)
Needs ov for ADD med refill. Last appt with wallace was may 2020.  Thanks.  Has toc scheduled in June.   Dr. Jonni Sanger

## 2019-06-09 NOTE — Telephone Encounter (Signed)
LAST APPOINTMENT DATE: 09/09/2018  NEXT APPOINTMENT DATE:10/23/2019   LAST REFILL: 03/24/2019  QTY: 90 day supply

## 2019-06-10 NOTE — Telephone Encounter (Signed)
Called pt and LVM to schedule appt

## 2019-06-23 ENCOUNTER — Encounter: Payer: Self-pay | Admitting: Family Medicine

## 2019-06-23 ENCOUNTER — Ambulatory Visit (INDEPENDENT_AMBULATORY_CARE_PROVIDER_SITE_OTHER): Payer: 59 | Admitting: Family Medicine

## 2019-06-23 DIAGNOSIS — F909 Attention-deficit hyperactivity disorder, unspecified type: Secondary | ICD-10-CM | POA: Diagnosis not present

## 2019-06-23 DIAGNOSIS — I10 Essential (primary) hypertension: Secondary | ICD-10-CM

## 2019-06-23 MED ORDER — AMPHETAMINE-DEXTROAMPHET ER 20 MG PO CP24: 20.0000 mg | ORAL_CAPSULE | Freq: Every day | ORAL | 0 refills | Status: DC

## 2019-06-23 MED ORDER — AMLODIPINE BESYLATE 10 MG PO TABS: 10.0000 mg | ORAL_TABLET | Freq: Every day | ORAL | 0 refills | Status: DC

## 2019-06-23 NOTE — Progress Notes (Signed)
Virtual Visit via Video Note  Subjective  CC:  Chief Complaint  Patient presents with  . Hypertension    Does not check at home. takes amlodipine. requesting refill  . ADHD    takes Adderall. Helps with symptoms needs refills.     I connected with Sherryll Desa on 06/23/19 at  4:20 PM EST by a video enabled telemedicine application and verified that I am speaking with the correct person using two identifiers. Location patient: Home Location provider: [facility] participating in the virtual visit: Labria Seamans, Leamon Arnt, MD Serita Sheller, CMA New pt to me; I reviewed chart. Last OV, virtual 08/2018. Last in office visit and labs 2019.   I discussed the limitations of evaluation and management by telemedicine and the availability of in person appointments. The patient expressed understanding and agreed to proceed. HPI: Laura Werner is a 34 y.o. female who was contacted today to address the problems listed above in the chief complaint/HTN f/u:  . Hypertension f/u: Control is uncertain. Started on treatment about 2 years ago.  Pt reports she is doing well. taking medications as instructed, no medication side effects noted, no TIAs, no chest pain on exertion, no dyspnea on exertion, no swelling of ankles. Had bp reading of 155/95 recently at work. On low dose amlodipine. Hasn't had in office reading since 02/2018. Feels fine.  She denies adverse effects from his BP medications. Compliance with medication is good.  . ADD: well controlled on meds. I reviewed patient's records from the PMP aware controlled substance registry today. High stress job. No AEs. Uses for work only. Arlean Hopping HM visit and lab work.   BP Readings from Last 3 Encounters:  03/19/18 126/74  11/20/17 114/76  10/29/17 122/88   Wt Readings from Last 3 Encounters:  06/23/19 298 lb (135.2 kg)  09/09/18 298 lb 12.8 oz (135.5 kg)  03/19/18 (!) 307 lb  12.8 oz (139.6 kg)    Lab Results  Component Value Date   CHOL 142 10/14/2017   CHOL 147 05/21/2012   Lab Results  Component Value Date   HDL 45.80 10/14/2017   HDL 47 05/21/2012   Lab Results  Component Value Date   LDLCALC 82 10/14/2017   LDLCALC 74 05/21/2012   Lab Results  Component Value Date   TRIG 74.0 10/14/2017   TRIG 130 05/21/2012   Lab Results  Component Value Date   CHOLHDL 3 10/14/2017   CHOLHDL 3.1 05/21/2012   No results found for: LDLDIRECT Lab Results  Component Value Date   CREATININE 0.74 10/14/2017   BUN 17 10/14/2017   NA 139 10/14/2017   K 4.1 10/14/2017   CL 105 10/14/2017   CO2 27 10/14/2017    The ASCVD Risk score (Goff DC Jr., et al., 2013) failed to calculate for the following reasons:   The 2013 ASCVD risk score is only valid for ages 1 to 86  Assessment  1. Morbid obesity ([place])   2. Essential hypertension   3. Attention deficit hyperactivity disorder (ADHD), unspecified ADHD type      Plan   Hypertension f/u:  Possibly uncontrolled. Will increase amlodipine. Pt to get numbers at her workplace for my review at follow up in office visit in 6 weeks. Needs labwork then as well. Has toc appt in June  ADD: well controlled. Refilled meds x 3 mnths.   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   I discussed the assessment and treatment plan with the patient. The patient was provided an opportunity to ask questions and all were answered. The patient agreed with the plan and demonstrated an understanding of the instructions.   The patient was advised to call back or seek an in-person evaluation if the symptoms worsen or if the condition fails to improve as anticipated. Follow up: Return in about 6 weeks (around 08/04/2019) for follow up Hypertension.  10/23/2019  Meds ordered this encounter  Medications  . amLODipine (NORVASC) 10 MG tablet    Sig: Take 1 tablet (10 mg total) by mouth daily.      Dispense:  90 tablet    Refill:  0  . DISCONTD: amphetamine-dextroamphetamine (ADDERALL XR) 20 MG 24 hr capsule    Sig: Take 1 capsule (20 mg total) by mouth daily.    Dispense:  30 capsule    Refill:  0  . DISCONTD: amphetamine-dextroamphetamine (ADDERALL XR) 20 MG 24 hr capsule    Sig: Take 1 capsule (20 mg total) by mouth daily.    Dispense:  30 capsule    Refill:  0    Please hold on file for pt  . amphetamine-dextroamphetamine (ADDERALL XR) 20 MG 24 hr capsule    Sig: Take 1 capsule (20 mg total) by mouth daily.    Dispense:  30 capsule    Refill:  0    Please hold on file for pt      I reviewed the patients updated PMH, FH, and SocHx.    Patient Active Problem List   Diagnosis Date Noted  . Insulin resistance 11/04/2017  . Attention deficit hyperactivity disorder (ADHD) 10/15/2017  . Essential hypertension 10/15/2017  . Morbid obesity ([place]) 02/12/2013   Current Meds  Medication Sig  . [START ON 08/22/2019] amphetamine-dextroamphetamine (ADDERALL XR) 20 MG 24 hr capsule Take 1 capsule (20 mg total) by mouth daily.  . [DISCONTINUED] amLODipine (NORVASC) 5 MG tablet Take 1 tablet (5 mg total) by mouth daily.  . [DISCONTINUED] amphetamine-dextroamphetamine (ADDERALL XR) 20 MG 24 hr capsule Take 1 capsule (20 mg total) by mouth daily.  . [DISCONTINUED] amphetamine-dextroamphetamine (ADDERALL XR) 20 MG 24 hr capsule Take 1 capsule (20 mg total) by mouth daily.  . [DISCONTINUED] amphetamine-dextroamphetamine (ADDERALL XR) 20 MG 24 hr capsule Take 1 capsule (20 mg total) by mouth daily.  . [DISCONTINUED] amphetamine-dextroamphetamine (ADDERALL XR) 20 MG 24 hr capsule Take 1 capsule (20 mg total) by mouth daily.  . [DISCONTINUED] amphetamine-dextroamphetamine (ADDERALL XR) 20 MG 24 hr capsule Take 1 capsule (20 mg total) by mouth daily.  . [DISCONTINUED] amphetamine-dextroamphetamine (ADDERALL XR) 20 MG 24 hr capsule Take 1 capsule (20 mg total) by mouth daily.  . [DISCONTINUED]  amphetamine-dextroamphetamine (ADDERALL XR) 20 MG 24 hr capsule Take 1 capsule (20 mg total) by mouth daily.    Allergies: Patient has No Known Allergies. Family History: Patient family history includes Alcohol abuse in her father; Breast cancer (age of onset: 61) in her mother; Depression in her mother; Diabetes in her mother; Hypertension in her mother and another family member; Lung cancer in her paternal grandfather and paternal grandmother; Melanoma in her maternal grandmother. Social History:  Patient  reports that she has never smoked. She has never used smokeless tobacco. She reports current alcohol use. She reports that she does not use drugs.  Review of Systems: Constitutional: Negative for fever malaise or anorexia Cardiovascular: negative for chest pain Respiratory: negative for  SOB or persistent cough Gastrointestinal: negative for abdominal pain  OBJECTIVE Vitals: Ht 5' 6.5" (1.689 m)   Wt 298 lb (135.2 kg)   LMP 05/26/2019 (Approximate)   BMI 47.38 kg/m  General: no acute distress , A&Ox3  Leamon Arnt, MD

## 2019-06-24 NOTE — Progress Notes (Signed)
Called and LVM for pt to schedule appt.

## 2019-08-09 ENCOUNTER — Encounter: Payer: Self-pay | Admitting: Family Medicine

## 2019-08-19 ENCOUNTER — Ambulatory Visit (INDEPENDENT_AMBULATORY_CARE_PROVIDER_SITE_OTHER): Payer: 59 | Admitting: Family Medicine

## 2019-08-19 ENCOUNTER — Other Ambulatory Visit: Payer: Self-pay

## 2019-08-19 ENCOUNTER — Encounter: Payer: Self-pay | Admitting: Family Medicine

## 2019-08-19 VITALS — BP 124/92 | HR 107 | Temp 98.0°F | Resp 15 | Ht 67.0 in | Wt 304.8 lb

## 2019-08-19 DIAGNOSIS — I10 Essential (primary) hypertension: Secondary | ICD-10-CM

## 2019-08-19 DIAGNOSIS — F900 Attention-deficit hyperactivity disorder, predominantly inattentive type: Secondary | ICD-10-CM

## 2019-08-19 DIAGNOSIS — E8881 Metabolic syndrome: Secondary | ICD-10-CM

## 2019-08-19 DIAGNOSIS — F909 Attention-deficit hyperactivity disorder, unspecified type: Secondary | ICD-10-CM

## 2019-08-19 LAB — COMPREHENSIVE METABOLIC PANEL
ALT: 28 U/L (ref 0–35)
AST: 18 U/L (ref 0–37)
Albumin: 3.9 g/dL (ref 3.5–5.2)
Alkaline Phosphatase: 109 U/L (ref 39–117)
BUN: 15 mg/dL (ref 6–23)
CO2: 28 mEq/L (ref 19–32)
Calcium: 9.2 mg/dL (ref 8.4–10.5)
Chloride: 103 mEq/L (ref 96–112)
Creatinine, Ser: 0.81 mg/dL (ref 0.40–1.20)
GFR: 81.39 mL/min (ref >60.00)
Glucose, Bld: 87 mg/dL (ref 70–99)
Potassium: 4 mEq/L (ref 3.5–5.1)
Sodium: 138 mEq/L (ref 135–145)
Total Bilirubin: 0.4 mg/dL (ref 0.2–1.2)
Total Protein: 6.3 g/dL (ref 6.0–8.3)

## 2019-08-19 LAB — LIPID PANEL
Cholesterol: 145 mg/dL (ref 0–200)
HDL: 44.1 mg/dL (ref >39.00)
LDL Cholesterol: 83 mg/dL (ref 0–99)
NonHDL: 101.02
Total CHOL/HDL Ratio: 3
Triglycerides: 88 mg/dL (ref 0.0–149.0)
VLDL: 17.6 mg/dL (ref 0.0–40.0)

## 2019-08-19 LAB — HEMOGLOBIN A1C: Hgb A1c MFr Bld: 5.1 % (ref 4.6–6.5)

## 2019-08-19 LAB — TSH: TSH: 3.11 u[IU]/mL (ref 0.35–4.50)

## 2019-08-19 MED ORDER — AMPHETAMINE-DEXTROAMPHET ER 20 MG PO CP24: 20.0000 mg | ORAL_CAPSULE | Freq: Every day | ORAL | 0 refills | Status: DC

## 2019-08-19 MED ORDER — HYDROCHLOROTHIAZIDE 25 MG PO TABS: 25.0000 mg | ORAL_TABLET | Freq: Every day | ORAL | 3 refills | Status: DC

## 2019-08-19 MED ORDER — AMLODIPINE BESYLATE 10 MG PO TABS: 10.0000 mg | ORAL_TABLET | Freq: Every day | ORAL | 0 refills | Status: DC

## 2019-08-19 NOTE — Patient Instructions (Signed)
Please follow up as scheduled for your next visit with me: 10/23/2019   I have given you your printed prescriptions for your ADD medications. Please keep them in a safe place so you will have them monthly to take to your pharmacy for refills as they come due. I will not be able to reprint these prescriptions so please treat them like money and keep them safe.   I have added a diuretic to your amlodipine to help control your blood pressure. You may take the medications together once a day.   I will release your lab results to you on your MyChart account with further instructions. Please reply with any questions.   If you have any questions or concerns, please don't hesitate to send me a message via MyChart or call the office at [phone]. Thank you for visiting with Laura Werner today! It's our pleasure caring for you.   Hypertension, Adult High blood pressure (hypertension) is when the force of blood pumping through the arteries is too strong. The arteries are the blood vessels that carry blood from the heart throughout the body. Hypertension forces the heart to work harder to pump blood and may cause arteries to become narrow or stiff. Untreated or uncontrolled hypertension can cause a heart attack, heart failure, a stroke, kidney disease, and other problems. A blood pressure reading consists of a higher number over a lower number. Ideally, your blood pressure should be below 120/80. The first ("top") number is called the systolic pressure. It is a measure of the pressure in your arteries as your heart beats. The second ("bottom") number is called the diastolic pressure. It is a measure of the pressure in your arteries as the heart relaxes. What are the causes? The exact cause of this condition is not known. There are some conditions that result in or are related to high blood pressure. What increases the risk? Some risk factors for high blood pressure are under your control. The following factors may make  you more likely to develop this condition:  Smoking.  Having type 2 diabetes mellitus, high cholesterol, or both.  Not getting enough exercise or physical activity.  Being overweight.  Having too much fat, sugar, calories, or salt (sodium) in your diet.  Drinking too much alcohol. Some risk factors for high blood pressure may be difficult or impossible to change. Some of these factors include:  Having chronic kidney disease.  Having a family history of high blood pressure.  Age. Risk increases with age.  Race. You may be at higher risk if you are African American.  Gender. Men are at higher risk than women before age 20. After age 30, women are at higher risk than men.  Having obstructive sleep apnea.  Stress. What are the signs or symptoms? High blood pressure may not cause symptoms. Very high blood pressure (hypertensive crisis) may cause:  Headache.  Anxiety.  Shortness of breath.  Nosebleed.  Nausea and vomiting.  Vision changes.  Severe chest pain.  Seizures. How is this diagnosed? This condition is diagnosed by measuring your blood pressure while you are seated, with your arm resting on a flat surface, your legs uncrossed, and your feet flat on the floor. The cuff of the blood pressure monitor will be placed directly against the skin of your upper arm at the level of your heart. It should be measured at least twice using the same arm. Certain conditions can cause a difference in blood pressure between your right and left arms. Certain  factors can cause blood pressure readings to be lower or higher than normal for a short period of time:  When your blood pressure is higher when you are in a health care provider's office than when you are at home, this is called white coat hypertension. Most people with this condition do not need medicines.  When your blood pressure is higher at home than when you are in a health care provider's office, this is called masked  hypertension. Most people with this condition may need medicines to control blood pressure. If you have a high blood pressure reading during one visit or you have normal blood pressure with other risk factors, you may be asked to:  Return on a different day to have your blood pressure checked again.  Monitor your blood pressure at home for 1 week or longer. If you are diagnosed with hypertension, you may have other blood or imaging tests to help your health care provider understand your overall risk for other conditions. How is this treated? This condition is treated by making healthy lifestyle changes, such as eating healthy foods, exercising more, and reducing your alcohol intake. Your health care provider may prescribe medicine if lifestyle changes are not enough to get your blood pressure under control, and if:  Your systolic blood pressure is above 130.  Your diastolic blood pressure is above 80. Your personal target blood pressure may vary depending on your medical conditions, your age, and other factors. Follow these instructions at home: Eating and drinking   Eat a diet that is high in fiber and potassium, and low in sodium, added sugar, and fat. An example eating plan is called the DASH (Dietary Approaches to Stop Hypertension) diet. To eat this way: ? Eat plenty of fresh fruits and vegetables. Try to fill one half of your plate at each meal with fruits and vegetables. ? Eat whole grains, such as whole-wheat pasta, brown rice, or whole-grain bread. Fill about one fourth of your plate with whole grains. ? Eat or drink low-fat dairy products, such as skim milk or low-fat yogurt. ? Avoid fatty cuts of meat, processed or cured meats, and poultry with skin. Fill about one fourth of your plate with lean proteins, such as fish, chicken without skin, beans, eggs, or tofu. ? Avoid pre-made and processed foods. These tend to be higher in sodium, added sugar, and fat.  Reduce your daily sodium  intake. Most people with hypertension should eat less than 1,500 mg of sodium a day.  Do not drink alcohol if: ? Your health care provider tells you not to drink. ? You are pregnant, may be pregnant, or are planning to become pregnant.  If you drink alcohol: ? Limit how much you use to:  0-1 drink a day for women.  0-2 drinks a day for men. ? Be aware of how much alcohol is in your drink. In the [place] one drink equals one 12 oz bottle of beer (355 mL), one 5 oz glass of wine (148 mL), or one 1 oz glass of hard liquor (44 mL). Lifestyle   Work with your health care provider to maintain a healthy body weight or to lose weight. Ask what an ideal weight is for you.  Get at least 30 minutes of exercise most days of the week. Activities may include walking, swimming, or biking.  Include exercise to strengthen your muscles (resistance exercise), such as Pilates or lifting weights, as part of your weekly exercise routine. Try to do these  types of exercises for 30 minutes at least 3 days a week.  Do not use any products that contain nicotine or tobacco, such as cigarettes, e-cigarettes, and chewing tobacco. If you need help quitting, ask your health care provider.  Monitor your blood pressure at home as told by your health care provider.  Keep all follow-up visits as told by your health care provider. This is important. Medicines  Take over-the-counter and prescription medicines only as told by your health care provider. Follow directions carefully. Blood pressure medicines must be taken as prescribed.  Do not skip doses of blood pressure medicine. Doing this puts you at risk for problems and can make the medicine less effective.  Ask your health care provider about side effects or reactions to medicines that you should watch for. Contact a health care provider if you:  Think you are having a reaction to a medicine you are taking.  Have headaches that keep coming back  (recurring).  Feel dizzy.  Have swelling in your ankles.  Have trouble with your vision. Get help right away if you:  Develop a severe headache or confusion.  Have unusual weakness or numbness.  Feel faint.  Have severe pain in your chest or abdomen.  Vomit repeatedly.  Have trouble breathing. Summary  Hypertension is when the force of blood pumping through your arteries is too strong. If this condition is not controlled, it may put you at risk for serious complications.  Your personal target blood pressure may vary depending on your medical conditions, your age, and other factors. For most people, a normal blood pressure is less than 120/80.  Hypertension is treated with lifestyle changes, medicines, or a combination of both. Lifestyle changes include losing weight, eating a healthy, low-sodium diet, exercising more, and limiting alcohol. This information is not intended to replace advice given to you by your health care provider. Make sure you discuss any questions you have with your health care provider. Document Revised: 12/25/2017 Document Reviewed: 12/25/2017 Elsevier Patient Education  2020 [employer].

## 2019-08-19 NOTE — Progress Notes (Signed)
Subjective  CC:  Chief Complaint  Patient presents with  . Hypertension    denies checking BP at home, states that she feels fine    HPI: Laura Werner is a 34 y.o. female who presents to the office today to address the problems listed above in the chief complaint.  Hypertension f/u: Control is fair . Pt reports she is doing well. taking medications as instructed, no medication side effects noted, no TIAs, no chest pain on exertion, no dyspnea on exertion, no swelling of ankles. 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  She denies adverse effects from his BP medications. Compliance with medication is fair. Both parents have HTN.   ADD: well controlled on meds. Due refills. Aware it can affect BP. No AEs  Obesity and h/o IR. + Fh of diabetes. No sxs of hyperglycemia.  Assessment  1. Essential hypertension   2. Insulin resistance   3. Morbid obesity ([place])   4. Attention deficit hyperactivity disorder (ADHD), predominantly inattentive type      Plan    Hypertension f/u: BP control is fairly well controlled. Add hctz. See AVS for education on diet recs and goals of care. Check labs today. Monitor potassium once on hctz  Obesity f/u: check lipids. Diet is a tough problem for her currently. Check a1c.  ADD: controlled. Refilled meds. Sent 30 day supply to pharmacy and printed RX x 2 more months. (total of 3 month refill). I reviewed patient's records from the PMP aware controlled substance registry today.   Education regarding management of these chronic disease states was given. Management strategies discussed on successive visits include dietary and exercise recommendations, goals of achieving and maintaining IBW, and lifestyle modifications aiming for adequate sleep and minimizing stressors.   Follow up: No follow-ups on file.  Orders Placed This Encounter  Procedures  . CBC with Differential/Platelet  .  Comprehensive metabolic panel  . Lipid panel  . TSH  . Hemoglobin A1c   No orders of the defined types were placed in this encounter.     BP Readings from Last 3 Encounters:  08/19/19 (!) 124/92  03/19/18 126/74  11/20/17 114/76   Wt Readings from Last 3 Encounters:  08/19/19 (!) 304 lb 12.8 oz (138.3 kg)  06/23/19 298 lb (135.2 kg)  09/09/18 298 lb 12.8 oz (135.5 kg)    Lab Results  Component Value Date   CHOL 142 10/14/2017   CHOL 147 05/21/2012   Lab Results  Component Value Date   HDL 45.80 10/14/2017   HDL 47 05/21/2012   Lab Results  Component Value Date   LDLCALC 82 10/14/2017   LDLCALC 74 05/21/2012   Lab Results  Component Value Date   TRIG 74.0 10/14/2017   TRIG 130 05/21/2012   Lab Results  Component Value Date   CHOLHDL 3 10/14/2017   CHOLHDL 3.1 05/21/2012   No results found for: LDLDIRECT Lab Results  Component Value Date   CREATININE 0.74 10/14/2017   BUN 17 10/14/2017   NA 139 10/14/2017   K 4.1 10/14/2017   CL 105 10/14/2017   CO2 27 10/14/2017   Lab Results  Component Value Date   HGBA1C 5.2 10/14/2017     The ASCVD Risk score (Goff DC Jr., et al., 2013) failed to calculate for the following reasons:   The 2013 ASCVD risk score is only valid for ages 64 to 36  I reviewed the  patients updated PMH, FH, and SocHx.    Patient Active Problem List   Diagnosis Date Noted  . Insulin resistance 11/04/2017  . Attention deficit hyperactivity disorder (ADHD) 10/15/2017  . Essential hypertension 10/15/2017  . Morbid obesity ([place]) 02/12/2013    Allergies: Patient has no known allergies.  Social History: Patient  reports that she has never smoked. She has never used smokeless tobacco. She reports current alcohol use. She reports that she does not use drugs.  Current Meds  Medication Sig  . amLODipine (NORVASC) 10 MG tablet Take 1 tablet (10 mg total) by mouth daily.  Derrill Memo ON 08/22/2019] amphetamine-dextroamphetamine (ADDERALL XR)  20 MG 24 hr capsule Take 1 capsule (20 mg total) by mouth daily.    Review of Systems: Cardiovascular: negative for chest pain, palpitations, leg swelling, orthopnea Respiratory: negative for SOB, wheezing or persistent cough Gastrointestinal: negative for abdominal pain Genitourinary: negative for dysuria or gross hematuria  Objective  Vitals: BP (!) 124/92   Pulse (!) 107   Temp 98 F (36.7 C) (Temporal)   Resp 15   Ht 5\' 7"  (1.702 m)   Wt (!) 304 lb 12.8 oz (138.3 kg)   SpO2 99%   BMI 47.74 kg/m  General: no acute distress  Psych:  Alert and oriented, normal mood and affect HEENT:  Normocephalic, atraumatic, supple neck  Cardiovascular:  RRR without murmur. no edema Respiratory:  Good breath sounds bilaterally, CTAB with normal respiratory effort Neurologic:   Mental status is normal  Commons side effects, risks, benefits, and alternatives for medications and treatment plan prescribed today were discussed, and the patient expressed understanding of the given instructions. Patient is instructed to call or message via MyChart if he/she has any questions or concerns regarding our treatment plan. No barriers to understanding were identified. We discussed Red Flag symptoms and signs in detail. Patient expressed understanding regarding what to do in case of urgent or emergency type symptoms.   Medication list was reconciled, printed and provided to the patient in AVS. Patient instructions and summary information was reviewed with the patient as documented in the AVS. This note was prepared with assistance of Dragon voice recognition software. Occasional wrong-word or sound-a-like substitutions may have occurred due to the inherent limitations of voice recognition software  This visit occurred during the SARS-CoV-2 public health emergency.  Safety protocols were in place, including screening questions prior to the visit, additional usage of staff PPE, and extensive cleaning of exam room  while observing appropriate contact time as indicated for disinfecting solutions.

## 2019-08-20 LAB — CBC WITH DIFFERENTIAL/PLATELET
Basophils Absolute: 0 10*3/uL (ref 0.0–0.1)
Basophils Relative: 0.5 % (ref 0.0–3.0)
Eosinophils Absolute: 0.2 10*3/uL (ref 0.0–0.7)
Eosinophils Relative: 1.7 % (ref 0.0–5.0)
HCT: 39.8 % (ref 36.0–46.0)
Hemoglobin: 13.3 g/dL (ref 12.0–15.0)
Lymphocytes Relative: 18.3 % (ref 12.0–46.0)
Lymphs Abs: 1.6 10*3/uL (ref 0.7–4.0)
MCHC: 33.3 g/dL (ref 30.0–36.0)
MCV: 81.3 fl (ref 78.0–100.0)
Monocytes Absolute: 0.5 10*3/uL (ref 0.1–1.0)
Monocytes Relative: 5 % (ref 3.0–12.0)
Neutro Abs: 6.7 10*3/uL (ref 1.4–7.7)
Neutrophils Relative %: 74.5 % (ref 43.0–77.0)
Platelets: 204 10*3/uL (ref 150.0–400.0)
RBC: 4.9 Mil/uL (ref 3.87–5.11)
RDW: 14.2 % (ref 11.5–15.5)
WBC: 9 10*3/uL (ref 4.0–10.5)

## 2019-09-09 ENCOUNTER — Other Ambulatory Visit: Payer: Self-pay | Admitting: *Deleted

## 2019-09-09 NOTE — Patient Outreach (Signed)
[facility]) Care Management  09/09/2019  Laura Werner 07/08/1985 [id number]  [facility] [employer] plan Referral Received 09/09/2019 Initial Outreach 09/09/2019 Conclusion: Referral to [facility] for HTN monitoring  RN spoke with pt today and introduced [facility] program and available services. Pt receptive and further inquired on her health. Pt states at this time she has recently obtain a new primary provider and her BP is getting "better". RN further inquired on services available to assist her with managing her HTN. Offered to enroll into the [facility] program with a health coach to assist with improving her ongoing management of care. Explained services are available to assist with printable education material for improving her dietary intake with low sodium and healthy eating habits, journal log for BP monitoring, goals and interventions to assist with managing his condition. Explained all services with [facility] are no charge and part of her benefits with [facility]. Pt receptive and agreed to a health coach for ongoing follow up calls with the plan mentioned above. Pt states she does not have a BP device but able to get one of the EMT at work to take if needed. RN offered to send via the health coach a device via mail out with other education material as to continue to manage this condition (pt receptive). RN discuss HTN and the risk involved if this goes unmonitored. Stress the importance of regular BP monitoring until she is stable with her medications and risk status.   PLAN: Will refer to a [employer] and requested to send pt a BP device for closer monitoring along with a [facility] calendar for documenting all her readings for her providers to view. No other inquires or questions at this time as pt very pleased with the enrollment for [facility] to assist.  Raina Mina, RN Care Management Coordinator [place] Office [phone]

## 2019-10-23 ENCOUNTER — Encounter: Payer: 59 | Admitting: Family Medicine

## 2019-11-27 ENCOUNTER — Other Ambulatory Visit: Payer: Self-pay | Admitting: Family Medicine

## 2019-11-27 DIAGNOSIS — I10 Essential (primary) hypertension: Secondary | ICD-10-CM

## 2020-01-06 ENCOUNTER — Other Ambulatory Visit: Payer: Self-pay

## 2020-01-06 ENCOUNTER — Encounter: Payer: Self-pay | Admitting: Family Medicine

## 2020-01-06 ENCOUNTER — Ambulatory Visit: Payer: BC Managed Care – PPO | Admitting: Family Medicine

## 2020-01-06 VITALS — BP 124/74 | HR 86 | Temp 97.7°F | Resp 18 | Ht 67.0 in | Wt 302.6 lb

## 2020-01-06 DIAGNOSIS — Z23 Encounter for immunization: Secondary | ICD-10-CM | POA: Diagnosis not present

## 2020-01-06 DIAGNOSIS — F909 Attention-deficit hyperactivity disorder, unspecified type: Secondary | ICD-10-CM

## 2020-01-06 DIAGNOSIS — I10 Essential (primary) hypertension: Secondary | ICD-10-CM | POA: Diagnosis not present

## 2020-01-06 MED ORDER — AMPHETAMINE-DEXTROAMPHET ER 20 MG PO CP24: 20.0000 mg | ORAL_CAPSULE | Freq: Every day | ORAL | 0 refills | Status: DC

## 2020-01-06 NOTE — Progress Notes (Signed)
Subjective  CC:  Chief Complaint  Patient presents with  . Hypertension    Tolerating medications well.     HPI: Laura Werner is a 34 y.o. female who presents to the office today to address the problems listed above in the chief complaint.  Hypertension f/u: Control is good . Pt reports she is doing well. taking medications as instructed, no medication side effects noted (although HCTZ does make her mouth dry in the morning and high-dose amlodipine gives her ankle swelling but it is resolved with the HCTZ), no TIAs, no chest pain on exertion, no dyspnea on exertion, no swelling of ankles. She denies adverse effects from his BP medications. Compliance with medication is good.  She is much less stressed since changing jobs.  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  Lifestyle is healthy as well  ADD: stable on meds.  Uses single dose of Adderall on workdays.  Still required to keep her on task.  No adverse effects.  Obesity: Has been meal prepping exercising pretty regularly.  Has been eating out more lately.  Weight is down. Assessment  1. Essential hypertension   2. Attention deficit hyperactivity disorder (ADHD), unspecified ADHD type   3. Morbid obesity ([place])      Plan    Hypertension f/u: BP control is well controlled.  Blood pressures look good.  We discussed changing medications to avoid side effects.  Patient feels like side effects are tolerable.  Continue amlodipine 10 HCTZ 25.  Check potassium today.  Patient can cut HCTZ to half dose and monitor blood pressures at home if she feels this will help her dry mouth in the morning.  Recheck 6 months  ADD f/u: Refill medications for 6 months.  Sent to pharmacy.  Well controlled.I reviewed patient's records from the PMP aware controlled substance registry today.  It is appropriate.  She did skip a few months.  Obesity: Education counseling done.  Patient to start exercising meal prepping  again.  Health maintenance: Flu shot updated today.  Counseling done Education regarding management of these chronic disease states was given. Management strategies discussed on successive visits include dietary and exercise recommendations, goals of achieving and maintaining IBW, and lifestyle modifications aiming for adequate sleep and minimizing stressors.   Follow up: Return in about 6 months (around 07/05/2020) for complete physical, follow up Hypertension, follow up on ADD.  Orders Placed This Encounter  Procedures  . Basic metabolic panel   Meds ordered this encounter  Medications  . amphetamine-dextroamphetamine (ADDERALL XR) 20 MG 24 hr capsule    Sig: Take 1 capsule (20 mg total) by mouth daily.    Dispense:  30 capsule    Refill:  0  . amphetamine-dextroamphetamine (ADDERALL XR) 20 MG 24 hr capsule    Sig: Take 1 capsule (20 mg total) by mouth daily.    Dispense:  30 capsule    Refill:  0    Please keep on file for next refill due Oct  . amphetamine-dextroamphetamine (ADDERALL XR) 20 MG 24 hr capsule    Sig: Take 1 capsule (20 mg total) by mouth daily.    Dispense:  30 capsule    Refill:  0    Please keep on file for next refill due Dec  . amphetamine-dextroamphetamine (ADDERALL XR) 20 MG 24 hr capsule    Sig: Take 1 capsule (20 mg total) by mouth daily.    Dispense:  30 capsule  Refill:  0    Please keep on file for next refill due January  . amphetamine-dextroamphetamine (ADDERALL XR) 20 MG 24 hr capsule    Sig: Take 1 capsule (20 mg total) by mouth daily.    Dispense:  30 capsule    Refill:  0    Please keep on file for next refill due February  . amphetamine-dextroamphetamine (ADDERALL XR) 20 MG 24 hr capsule    Sig: Take 1 capsule (20 mg total) by mouth daily.    Dispense:  30 capsule    Refill:  0    Please keep on file for next refill due November      BP Readings from Last 3 Encounters:  01/06/20 124/74  08/19/19 (!) 124/92  03/19/18 126/74   Wt  Readings from Last 3 Encounters:  01/06/20 (!) 302 lb 9.6 oz (137.3 kg)  08/19/19 (!) 304 lb 12.8 oz (138.3 kg)  06/23/19 298 lb (135.2 kg)    Lab Results  Component Value Date   CHOL 145 08/19/2019   CHOL 142 10/14/2017   CHOL 147 05/21/2012   Lab Results  Component Value Date   HDL 44.10 08/19/2019   HDL 45.80 10/14/2017   HDL 47 05/21/2012   Lab Results  Component Value Date   LDLCALC 83 08/19/2019   LDLCALC 82 10/14/2017   LDLCALC 74 05/21/2012   Lab Results  Component Value Date   TRIG 88.0 08/19/2019   TRIG 74.0 10/14/2017   TRIG 130 05/21/2012   Lab Results  Component Value Date   CHOLHDL 3 08/19/2019   CHOLHDL 3 10/14/2017   CHOLHDL 3.1 05/21/2012   No results found for: LDLDIRECT Lab Results  Component Value Date   CREATININE 0.81 08/19/2019   BUN 15 08/19/2019   NA 138 08/19/2019   K 4.0 08/19/2019   CL 103 08/19/2019   CO2 28 08/19/2019    The ASCVD Risk score (Goff DC Jr., et al., 2013) failed to calculate for the following reasons:   The 2013 ASCVD risk score is only valid for ages 47 to 89  I reviewed the patients updated PMH, FH, and SocHx.    Patient Active Problem List   Diagnosis Date Noted  . Attention deficit hyperactivity disorder (ADHD) 10/15/2017  . Essential hypertension 10/15/2017  . Morbid obesity ([place]) 02/12/2013    Allergies: Patient has no known allergies.  Social History: Patient  reports that she has never smoked. She has never used smokeless tobacco. She reports current alcohol use. She reports that she does not use drugs.  Current Meds  Medication Sig  . amLODipine (NORVASC) 10 MG tablet TAKE 1 TABLET(10 MG) BY MOUTH DAILY  . amphetamine-dextroamphetamine (ADDERALL XR) 20 MG 24 hr capsule Take 1 capsule (20 mg total) by mouth daily.  Derrill Memo ON 05/05/2020] amphetamine-dextroamphetamine (ADDERALL XR) 20 MG 24 hr capsule Take 1 capsule (20 mg total) by mouth daily.  Derrill Memo ON 06/04/2020] amphetamine-dextroamphetamine  (ADDERALL XR) 20 MG 24 hr capsule Take 1 capsule (20 mg total) by mouth daily.  . hydrochlorothiazide (HYDRODIURIL) 25 MG tablet Take 1 tablet (25 mg total) by mouth daily.  . [DISCONTINUED] amphetamine-dextroamphetamine (ADDERALL XR) 20 MG 24 hr capsule Take 1 capsule (20 mg total) by mouth daily.    Review of Systems: Cardiovascular: negative for chest pain, palpitations, leg swelling, orthopnea Respiratory: negative for SOB, wheezing or persistent cough Gastrointestinal: negative for abdominal pain Genitourinary: negative for dysuria or gross hematuria  Objective  Vitals: BP 124/74  Pulse 86   Temp 97.7 F (36.5 C) (Temporal)   Resp 18   Ht 5\' 7"  (1.702 m)   Wt (!) 302 lb 9.6 oz (137.3 kg)   SpO2 97%   BMI 47.39 kg/m  General: no acute distress  Psych:  Alert and oriented, normal mood and affect Cardiovascular:  RRR without murmur. no edema Respiratory:  Good breath sounds bilaterally, CTAB with normal respiratory effort Neurologic:   Mental status is normal   Commons side effects, risks, benefits, and alternatives for medications and treatment plan prescribed today were discussed, and the patient expressed understanding of the given instructions. Patient is instructed to call or message via MyChart if he/she has any questions or concerns regarding our treatment plan. No barriers to understanding were identified. We discussed Red Flag symptoms and signs in detail. Patient expressed understanding regarding what to do in case of urgent or emergency type symptoms.   Medication list was reconciled, printed and provided to the patient in AVS. Patient instructions and summary information was reviewed with the patient as documented in the AVS. This note was prepared with assistance of Dragon voice recognition software. Occasional wrong-word or sound-a-like substitutions may have occurred due to the inherent limitations of voice recognition software  This visit occurred during the  SARS-CoV-2 public health emergency.  Safety protocols were in place, including screening questions prior to the visit, additional usage of staff PPE, and extensive cleaning of exam room while observing appropriate contact time as indicated for disinfecting solutions.

## 2020-01-06 NOTE — Patient Instructions (Signed)
Please return in 6 months for your annual complete physical; please come fasting. Recheck BP and ADD  I will release your lab results to you on your MyChart account with further instructions. Please reply with any questions.   I have refilled your ADD meds for 6 months. Pharmacy should have them on file for you.   If you have any questions or concerns, please don't hesitate to send me a message via MyChart or call the office at [phone]. Thank you for visiting with Laura Werner today! It's our pleasure caring for you.

## 2020-01-06 NOTE — Addendum Note (Signed)
Addended by: Thomes Cake on: 01/06/2020 08:34 AM   Modules accepted: Orders

## 2020-01-06 NOTE — Addendum Note (Signed)
Addended by: Thomes Cake on: 01/06/2020 08:33 AM   Modules accepted: Orders

## 2020-01-07 LAB — BASIC METABOLIC PANEL
BUN: 15 mg/dL (ref 7–25)
CO2: 31 mmol/L (ref 20–32)
Calcium: 8.8 mg/dL (ref 8.6–10.2)
Chloride: 100 mmol/L (ref 98–110)
Creat: 0.78 mg/dL (ref 0.50–1.10)
Glucose, Bld: 107 mg/dL — ABNORMAL HIGH (ref 65–99)
Potassium: 3.6 mmol/L (ref 3.5–5.3)
Sodium: 139 mmol/L (ref 135–146)

## 2020-02-04 ENCOUNTER — Telehealth (INDEPENDENT_AMBULATORY_CARE_PROVIDER_SITE_OTHER): Payer: BC Managed Care – PPO | Admitting: Family Medicine

## 2020-02-04 ENCOUNTER — Encounter: Payer: Self-pay | Admitting: Family Medicine

## 2020-02-04 VITALS — Temp 98.4°F

## 2020-02-04 DIAGNOSIS — J029 Acute pharyngitis, unspecified: Secondary | ICD-10-CM

## 2020-02-04 NOTE — Patient Instructions (Signed)
   ---------------------------------------------------------------------------------------------------------------------------      WORK SLIP:  Patient Laura Werner,  [birth year], was seen for a medical visit today, 02/04/20 . Please excuse from work according to the [facility] guidelines for a COVID like illness. We advise 10 days minimum from the onset of symptoms (02/02/2020) PLUS 1 day of no fever and improved symptoms. Will defer to employer for a sooner return to work if [place] testing is negative and the symptoms have resolved. Advise following CDC guidelines.  She/He may work remotely from home in self isolation if she is feeling better and wishes to do so.  Sincerely: E-signature: Dr. Colin Benton, DO [place] Ph: [phone]   ------------------------------------------------------------------------------------------------------------------------------    -repeat the covid test as planned  -call your primary care office to see if they can set up a strep test for you curbside  -Gargling warm salt water can help  -Aleve or Tylenol as needed per instructions  -Stay home while sick and for full 10 days if you have a positive Covid test  -I hope you're feeling better soon! Please follow-up with your primary care doctor or via telemedicine or in urgent care if your symptoms are worsening or if they do not resolve over the next few days.

## 2020-02-04 NOTE — Progress Notes (Signed)
Virtual Visit via Video Note  I connected with Laura Werner  on 02/04/20 at  3:00 PM EDT by a video enabled telemedicine application and verified that I am speaking with the correct person using two identifiers.  Location patient: home, [place] Location provider:work or home office Persons participating in the virtual visit: patient, provider  I discussed the limitations of evaluation and management by telemedicine and the availability of in person appointments. The patient expressed understanding and agreed to proceed.   HPI:  Acute telemedicine visit for sore throat: -Onset: 2 days ago -Symptoms include: sore throat, fever up to 100.4, fatigue, sneezing -had a negative COVID19 test at home yesterday -temp 98.4 this morning -Denies: SOB, CP, NVD, loss of taste or smell, inability to get out of bed -had been at a group gathering without masks recently -Pertinent past medical history: obesity, HTN -Pertinent medication allergies:nkda -COVID-19 vaccine status: fully vaccinated with pfizer -She is worried about strep throat, as her mother saw pus on her tonsils earlier; she reports she was told by her primary care office that she could come do a strep test there after the video visit  ROS: See pertinent positives and negatives per HPI.  Past Medical History:  Diagnosis Date  . ADHD (attention deficit hyperactivity disorder), combined type   . Benign joint hypermobility   . HTN (hypertension)     Past Surgical History:  Procedure Laterality Date  . ARTHROSCOPIC REPAIR ACL  2018     Current Outpatient Medications:  .  amLODipine (NORVASC) 10 MG tablet, TAKE 1 TABLET(10 MG) BY MOUTH DAILY, Disp: 90 tablet, Rfl: 0 .  amphetamine-dextroamphetamine (ADDERALL XR) 20 MG 24 hr capsule, Take 1 capsule (20 mg total) by mouth daily., Disp: 30 capsule, Rfl: 0 .  hydrochlorothiazide (HYDRODIURIL) 25 MG tablet, Take 1 tablet (25 mg total) by mouth daily., Disp: 90 tablet, Rfl: 3 .  [START ON 02/05/2020]  amphetamine-dextroamphetamine (ADDERALL XR) 20 MG 24 hr capsule, Take 1 capsule (20 mg total) by mouth daily. (Patient not taking: Reported on 02/04/2020), Disp: 30 capsule, Rfl: 0 .  [START ON 04/05/2020] amphetamine-dextroamphetamine (ADDERALL XR) 20 MG 24 hr capsule, Take 1 capsule (20 mg total) by mouth daily. (Patient not taking: Reported on 02/04/2020), Disp: 30 capsule, Rfl: 0 .  [START ON 05/05/2020] amphetamine-dextroamphetamine (ADDERALL XR) 20 MG 24 hr capsule, Take 1 capsule (20 mg total) by mouth daily. (Patient not taking: Reported on 02/04/2020), Disp: 30 capsule, Rfl: 0 .  [START ON 06/04/2020] amphetamine-dextroamphetamine (ADDERALL XR) 20 MG 24 hr capsule, Take 1 capsule (20 mg total) by mouth daily. (Patient not taking: Reported on 02/04/2020), Disp: 30 capsule, Rfl: 0 .  [START ON 03/06/2020] amphetamine-dextroamphetamine (ADDERALL XR) 20 MG 24 hr capsule, Take 1 capsule (20 mg total) by mouth daily. (Patient not taking: Reported on 02/04/2020), Disp: 30 capsule, Rfl: 0  EXAM:  VITALS per patient if applicable:  GENERAL: alert, oriented, appears well and in no acute distress  HEENT: atraumatic, conjunttiva clear, no obvious abnormalities on inspection of external nose and ears, mild erythema and tonsillar edema  NECK: normal movements of the head and neck  LUNGS: on inspection no signs of respiratory distress, breathing rate appears normal, no obvious gross SOB, gasping or wheezing  CV: no obvious cyanosis  MS: moves all visible extremities without noticeable abnormality  PSYCH/NEURO: pleasant and cooperative, no obvious depression or anxiety, speech and thought processing grossly intact  ASSESSMENT AND PLAN:  Discussed the following assessment and plan:  Sore throat  -  we discussed possible serious and likely etiologies, options for evaluation and workup, limitations of telemedicine visit vs in person visit, treatment, treatment risks and precautions. Pt prefers to treat via  telemedicine empirically rather than in person at this moment. Discussed potential etiologies including viral upper respiratory illness, breakthrough COVID-19 (though seems less likely with a negative test and she is fully vaccinated), strep throat versus other. She plans to do another COVID-19 test, which is advisable given she used a home test. She also plans to do a strep test at her PCP office. Did let her know that I only work on Tuesdays and Thursdays. Work/School slipped offered: provided in patient instructions   Scheduled follow up with PCP offered: Agrees to follow-up if needed. Advised to seek prompt follow up telemedicine visit or in person care if worsening, new symptoms arise, or if is not improving with treatment. Did let this patient know that I only do telemedicine on Tuesdays and Thursdays for [place]. Advised to schedule follow up visit with PCP or [facility] if any further questions or concerns to avoid delays in care.   I discussed the assessment and treatment plan with the patient. The patient was provided an opportunity to ask questions and all were answered. The patient agreed with the plan and demonstrated an understanding of the instructions.     Laura Kern, DO

## 2020-02-25 ENCOUNTER — Other Ambulatory Visit: Payer: Self-pay | Admitting: Family Medicine

## 2020-02-25 DIAGNOSIS — I10 Essential (primary) hypertension: Secondary | ICD-10-CM

## 2020-03-28 ENCOUNTER — Other Ambulatory Visit: Payer: Self-pay

## 2020-03-28 ENCOUNTER — Encounter: Payer: Self-pay | Admitting: Family Medicine

## 2020-05-23 ENCOUNTER — Other Ambulatory Visit: Payer: Self-pay | Admitting: *Deleted

## 2020-05-23 NOTE — Patient Outreach (Signed)
[place] [facility]) Care Management  05/23/2020  Laura Werner 03-Aug-1985 [id number]   Telephone Assessment-Unsuccessful outreach  RN attempted outreach call to pt however unsuccessful. Will attempt another outreach over the next week.  Raina Mina, RN Care Management Coordinator [place] Office [phone]

## 2020-05-27 ENCOUNTER — Other Ambulatory Visit: Payer: Self-pay | Admitting: *Deleted

## 2020-05-27 NOTE — Patient Outreach (Signed)
[place] [facility]) Care Management  05/27/2020  Laura Werner [facility] October 20, 1985 [id number]   Telephone Assessment Outreach #2  RN attempted outreach however unsuccessful. RN left a HIPAA approved voice message.   Will attempt another outreach call over the next week for pending [facility] services.   Raina Mina, RN Care Management Coordinator [place] Office [phone]

## 2020-06-01 ENCOUNTER — Other Ambulatory Visit: Payer: Self-pay | Admitting: *Deleted

## 2020-06-01 NOTE — Patient Outreach (Signed)
[place] [facility]) Care Management  06/01/2020  Shiron Whetsel [facility] 1985/09/02 [id number]   Telephone Assessment-Unsuccessful  Outreach #3 RN attempted outreach call today however unsuccessful. RN able to leave a HIPAA approved voice message requesting a call back.  Will attempt another outreach call over the next week.  Raina Mina, RN Care Management Coordinator [place] Office [phone]

## 2020-06-30 ENCOUNTER — Other Ambulatory Visit: Payer: Self-pay | Admitting: *Deleted

## 2020-06-30 NOTE — Patient Outreach (Signed)
[facility] [facility]) Care Management  06/30/2020  Lidwina Kaner [birth year] [id number]   Telephone Assessment-Declined  RN spoke with pt today and introduced [facility] services. Pt reports she is managing her HTN with her ongoing medication and visits her provider twice annually with no encountered problems. Pt states she is doing well and opt to decline [facility] services at this time. States she works nights and usually sleeps all day.   Again decline [facility] services and feels she is managing her condition with no acute issues at this time. Pt receptive to receiving information on [facility] services and indicated if services are needed in the future she would contact [place] directly.  Will send packet and close this case according to the pt's request.   Raina Mina, RN Care Management Coordinator [place] Office [phone]

## 2020-07-20 ENCOUNTER — Other Ambulatory Visit: Payer: Self-pay | Admitting: Family Medicine

## 2020-07-20 DIAGNOSIS — F909 Attention-deficit hyperactivity disorder, unspecified type: Secondary | ICD-10-CM

## 2020-07-21 ENCOUNTER — Telehealth: Payer: Self-pay

## 2020-07-21 NOTE — Telephone Encounter (Signed)
Please schedule patient for ADD f/u. Can use same day and can be virtual

## 2020-07-21 NOTE — Telephone Encounter (Signed)
  LAST APPOINTMENT DATE: 01/06/2020   NEXT APPOINTMENT DATE:@5 /03/2021  MEDICATION:amphetamine-dextroamphetamine (ADDERALL XR) 20 MG 24 hr capsule  PHARMACY:[id number] - [place], [place] - [address] AT [address]  Comments: Patient has three pills left, and is scheduled for the first available physical in May, wanting to know if she can get refills to last until that appointment.   Please advise

## 2020-07-21 NOTE — Telephone Encounter (Signed)
Yes, unfortunately, must have ADD visit minimum of every 6 months.

## 2020-07-21 NOTE — Telephone Encounter (Signed)
She has CPE scheduled in May. Do you want ADHD appt scheduled sooner?

## 2020-07-21 NOTE — Telephone Encounter (Signed)
Last refill: 06/04/20 #30, 0 Last OV: 01/06/20 dx. HTN

## 2020-07-21 NOTE — Telephone Encounter (Signed)
Pt is scheduled to see Dr. Jonni Sanger virtually on Monday 07/25/2020.

## 2020-07-21 NOTE — Telephone Encounter (Signed)
Please notify patient she must have an office visit every 6 months for ADD medications because they are controlled substances. Please schedule. Virtual visit is fine if needed.  And also please ask to schedule her CPE visit for June now. Thanks.

## 2020-07-25 ENCOUNTER — Telehealth (INDEPENDENT_AMBULATORY_CARE_PROVIDER_SITE_OTHER): Payer: 59 | Admitting: Family Medicine

## 2020-07-25 ENCOUNTER — Encounter: Payer: Self-pay | Admitting: Family Medicine

## 2020-07-25 DIAGNOSIS — F909 Attention-deficit hyperactivity disorder, unspecified type: Secondary | ICD-10-CM

## 2020-07-25 DIAGNOSIS — I10 Essential (primary) hypertension: Secondary | ICD-10-CM

## 2020-07-25 DIAGNOSIS — F900 Attention-deficit hyperactivity disorder, predominantly inattentive type: Secondary | ICD-10-CM

## 2020-07-25 MED ORDER — AMPHETAMINE-DEXTROAMPHET ER 20 MG PO CP24: 20.0000 mg | ORAL_CAPSULE | Freq: Every day | ORAL | 0 refills | Status: DC

## 2020-07-25 MED ORDER — AMPHETAMINE-DEXTROAMPHET ER 20 MG PO CP24
20.0000 mg | ORAL_CAPSULE | Freq: Every day | ORAL | 0 refills | Status: DC
Start: 2020-08-24 — End: 2021-01-10

## 2020-07-25 NOTE — Progress Notes (Signed)
Virtual Visit via Video Note  Subjective  CC:  Chief Complaint  Patient presents with  . ADD  . Hypertension    Denies checking BP at home, does not have cuff.      I connected with Laura Werner on 07/25/20 at  1:30 PM EDT by a video enabled telemedicine application and verified that I am speaking with the correct person using two identifiers. Location patient: Home Location provider: [facility] [place], Office Persons participating in the virtual visit: Nguyet Mercer, Leamon Arnt, MD Reymundo Poll CMA  I discussed the limitations of evaluation and management by telemedicine and the availability of in person appointments. The patient expressed understanding and agreed to proceed. HPI: Laura Werner is a 35 y.o. female who was contacted today to address the problems listed above in the chief complaint. . ADD: Continues to use Adderall 20 mg for work.  She never uses them on the weekends.  Continues to help with focus and attention.  No adverse side effects.  No palpitations, problems with sleep or weight loss.  Appetite is good. Marland Kitchen Health maintenance: Has complete physical scheduled in May. . Hypertension: Feels well without chest pain or shortness of breath.  Compliant with medications.   Assessment  1. Attention deficit hyperactivity disorder (ADHD), predominantly inattentive type   2. Essential hypertension      Plan   ADD this medical condition is well controlled. There are no signs of complications, medication side effects, or red flags. Patient is instructed to continue the current treatment plan without change in therapies or medications.:  Refill medications.  Since she does not take daily refilled for 3 months.  This typically lasts for about 6 months.  Hypertension: Asymptomatic.  Will return in May for recheck.  Continue hydrochlorothiazide 25 mg daily. I discussed the assessment and treatment plan with the patient.  The patient was provided an opportunity to ask questions and all were answered. The patient agreed with the plan and demonstrated an understanding of the instructions.   The patient was advised to call back or seek an in-person evaluation if the symptoms worsen or if the condition fails to improve as anticipated. Follow up: For complete physical 09/08/2020  No orders of the defined types were placed in this encounter.     I reviewed the patients updated PMH, FH, and SocHx.    Patient Active Problem List   Diagnosis Date Noted  . Attention deficit hyperactivity disorder (ADHD) 10/15/2017  . Essential hypertension 10/15/2017  . Morbid obesity ([place]) 02/12/2013   Current Meds  Medication Sig  . amLODipine (NORVASC) 10 MG tablet TAKE 1 TABLET(10 MG) BY MOUTH DAILY  . amphetamine-dextroamphetamine (ADDERALL XR) 20 MG 24 hr capsule Take 1 capsule (20 mg total) by mouth daily.  Marland Kitchen amphetamine-dextroamphetamine (ADDERALL XR) 20 MG 24 hr capsule Take 1 capsule (20 mg total) by mouth daily.  Marland Kitchen amphetamine-dextroamphetamine (ADDERALL XR) 20 MG 24 hr capsule Take 1 capsule (20 mg total) by mouth daily.  Marland Kitchen amphetamine-dextroamphetamine (ADDERALL XR) 20 MG 24 hr capsule Take 1 capsule (20 mg total) by mouth daily.  Marland Kitchen amphetamine-dextroamphetamine (ADDERALL XR) 20 MG 24 hr capsule Take 1 capsule (20 mg total) by mouth daily.  Marland Kitchen amphetamine-dextroamphetamine (ADDERALL XR) 20 MG 24 hr capsule Take 1 capsule (20 mg total) by mouth daily.  . hydrochlorothiazide (HYDRODIURIL) 25 MG tablet Take 1 tablet (25 mg total) by mouth daily.    Allergies: Patient  has No Known Allergies. Family History: Patient family history includes Alcohol abuse in her father; Breast cancer (age of onset: 50) in her mother; Depression in her mother; Diabetes in her mother; Healthy in her brother; Hypertension in her mother and another family member; Lung cancer in her paternal grandfather and paternal grandmother; Melanoma in her  maternal grandmother. Social History:  Patient  reports that she has never smoked. She has never used smokeless tobacco. She reports current alcohol use. She reports that she does not use drugs.  Review of Systems: Constitutional: Negative for fever malaise or anorexia Cardiovascular: negative for chest pain Respiratory: negative for SOB or persistent cough Gastrointestinal: negative for abdominal pain  OBJECTIVE Vitals: There were no vitals taken for this visit. General: no acute distress , A&Ox3  Leamon Arnt, MD

## 2020-08-24 ENCOUNTER — Other Ambulatory Visit: Payer: Self-pay

## 2020-08-24 ENCOUNTER — Encounter (INDEPENDENT_AMBULATORY_CARE_PROVIDER_SITE_OTHER): Payer: Self-pay | Admitting: Bariatrics

## 2020-08-24 ENCOUNTER — Ambulatory Visit (INDEPENDENT_AMBULATORY_CARE_PROVIDER_SITE_OTHER): Payer: 59 | Admitting: Bariatrics

## 2020-08-24 VITALS — BP 118/66 | HR 62 | Temp 97.8°F | Ht 67.0 in | Wt 313.0 lb

## 2020-08-24 DIAGNOSIS — Z0289 Encounter for other administrative examinations: Secondary | ICD-10-CM

## 2020-08-24 DIAGNOSIS — R7309 Other abnormal glucose: Secondary | ICD-10-CM | POA: Insufficient documentation

## 2020-08-24 DIAGNOSIS — E8881 Metabolic syndrome: Secondary | ICD-10-CM | POA: Diagnosis not present

## 2020-08-24 DIAGNOSIS — F900 Attention-deficit hyperactivity disorder, predominantly inattentive type: Secondary | ICD-10-CM

## 2020-08-24 DIAGNOSIS — Z1331 Encounter for screening for depression: Secondary | ICD-10-CM | POA: Diagnosis not present

## 2020-08-24 DIAGNOSIS — E559 Vitamin D deficiency, unspecified: Secondary | ICD-10-CM | POA: Diagnosis not present

## 2020-08-24 DIAGNOSIS — R5383 Other fatigue: Secondary | ICD-10-CM

## 2020-08-24 DIAGNOSIS — Z9189 Other specified personal risk factors, not elsewhere classified: Secondary | ICD-10-CM

## 2020-08-24 DIAGNOSIS — E88819 Insulin resistance, unspecified: Secondary | ICD-10-CM

## 2020-08-24 DIAGNOSIS — I10 Essential (primary) hypertension: Secondary | ICD-10-CM

## 2020-08-24 DIAGNOSIS — R0602 Shortness of breath: Secondary | ICD-10-CM

## 2020-08-24 DIAGNOSIS — E66813 Obesity, class 3: Secondary | ICD-10-CM

## 2020-08-24 DIAGNOSIS — Z6841 Body Mass Index (BMI) 40.0 and over, adult: Secondary | ICD-10-CM

## 2020-08-25 LAB — COMPREHENSIVE METABOLIC PANEL
ALT: 42 IU/L — ABNORMAL HIGH (ref 0–32)
AST: 23 IU/L (ref 0–40)
Albumin/Globulin Ratio: 1.8 (ref 1.2–2.2)
Albumin: 4.4 g/dL (ref 3.8–4.8)
Alkaline Phosphatase: 130 IU/L — ABNORMAL HIGH (ref 44–121)
BUN/Creatinine Ratio: 21 (ref 9–23)
BUN: 15 mg/dL (ref 6–20)
Bilirubin Total: 0.3 mg/dL (ref 0.0–1.2)
CO2: 26 mmol/L (ref 20–29)
Calcium: 9.1 mg/dL (ref 8.7–10.2)
Chloride: 97 mmol/L (ref 96–106)
Creatinine, Ser: 0.71 mg/dL (ref 0.57–1.00)
Globulin, Total: 2.4 g/dL (ref 1.5–4.5)
Glucose: 112 mg/dL — ABNORMAL HIGH (ref 65–99)
Potassium: 3.4 mmol/L — ABNORMAL LOW (ref 3.5–5.2)
Sodium: 138 mmol/L (ref 134–144)
Total Protein: 6.8 g/dL (ref 6.0–8.5)
eGFR: 114 mL/min/{1.73_m2} (ref >59)

## 2020-08-25 LAB — T3: T3, Total: 164 ng/dL (ref 71–180)

## 2020-08-25 LAB — HEMOGLOBIN A1C
Est. average glucose Bld gHb Est-mCnc: 114 mg/dL
Hgb A1c MFr Bld: 5.6 % (ref 4.8–5.6)

## 2020-08-25 LAB — T4, FREE: Free T4: 1.27 ng/dL (ref 0.82–1.77)

## 2020-08-25 LAB — VITAMIN D 25 HYDROXY (VIT D DEFICIENCY, FRACTURES): Vit D, 25-Hydroxy: 21.2 ng/mL — ABNORMAL LOW (ref 30.0–100.0)

## 2020-08-25 LAB — LIPID PANEL WITH LDL/HDL RATIO
Cholesterol, Total: 162 mg/dL (ref 100–199)
HDL: 45 mg/dL (ref >39)
LDL Chol Calc (NIH): 104 mg/dL — ABNORMAL HIGH (ref 0–99)
LDL/HDL Ratio: 2.3 ratio (ref 0.0–3.2)
Triglycerides: 68 mg/dL (ref 0–149)
VLDL Cholesterol Cal: 13 mg/dL (ref 5–40)

## 2020-08-25 LAB — INSULIN, RANDOM: INSULIN: 21.4 u[IU]/mL (ref 2.6–24.9)

## 2020-08-25 LAB — TSH: TSH: 3.43 u[IU]/mL (ref 0.450–4.500)

## 2020-08-29 ENCOUNTER — Encounter (INDEPENDENT_AMBULATORY_CARE_PROVIDER_SITE_OTHER): Payer: Self-pay | Admitting: Bariatrics

## 2020-08-29 DIAGNOSIS — E559 Vitamin D deficiency, unspecified: Secondary | ICD-10-CM | POA: Insufficient documentation

## 2020-08-30 ENCOUNTER — Encounter (INDEPENDENT_AMBULATORY_CARE_PROVIDER_SITE_OTHER): Payer: Self-pay | Admitting: Bariatrics

## 2020-08-30 NOTE — Progress Notes (Signed)
Chief Complaint:   OBESITY Laura Werner (MR# [id number]) is a 35 y.o. female who presents for evaluation and treatment of obesity and related comorbidities. Current BMI is Body mass index is 49.02 kg/m. Laura Werner has been struggling with her weight for many years and has been unsuccessful in either losing weight, maintaining weight loss, or reaching her healthy weight goal.  Laura Werner does like to cook, but she notes time as an obstacle. She craves salty/sweet snacks, and she craves sweets at night. She was a patient of Dr. Juleen China in the past.  Laura Werner is currently in the action stage of change and ready to dedicate time achieving and maintaining a healthier weight. Laura Werner is interested in becoming our patient and working on intensive lifestyle modifications including (but not limited to) diet and exercise for weight loss.  Laura Werner's habits were reviewed today and are as follows: her desired weight loss is 113-133 lbs, she has been heavy most of her life, she started gaining weight at 44-2 years old, her heaviest weight ever was 315 pounds, she has significant food cravings issues, she snacks frequently in the evenings, she skips meals frequently, she is frequently drinking liquids with calories, she frequently makes poor food choices, she frequently eats larger portions than normal and she struggles with emotional eating.  Depression Screen Laura Werner's Food and Mood (modified PHQ-9) score was 9.  Depression screen [name] 2/9 08/24/2020  Decreased Interest 1  Down, Depressed, Hopeless 1  PHQ - 2 Score 2  Altered sleeping 0  Tired, decreased energy 2  Change in appetite 0  Feeling bad or failure about yourself  2  Trouble concentrating 3  Moving slowly or fidgety/restless 0  Suicidal thoughts 0  PHQ-9 Score 9  Difficult doing work/chores Not difficult at all   Subjective:   1. Other fatigue [name] admits to daytime somnolence and admits to waking up still tired. Patent has a history of  symptoms of daytime fatigue and morning headache. Laura Werner generally gets 6 or 7 hours of sleep per night, and states that she has difficulty falling asleep. Snoring is present. Apneic episodes are not present. Epworth Sleepiness Score is 10.  2. SOB (shortness of breath) on exertion Laura Werner notes increasing shortness of breath with exercising and seems to be worsening over time with weight gain. She notes getting out of breath sooner with activity than she used to. This has not gotten worse recently. Laura Werner denies shortness of breath at rest or orthopnea.  3. Essential hypertension [name] is taking hydrochlorothiazide and amlodipine.  4. Insulin resistance Laura Werner is not on medications currently.  5. Attention deficit hyperactivity disorder (ADHD), predominantly inattentive type [name] is currently taking Adderall.  6. Vitamin D deficiency Laura Werner is not on Vit D supplementation.  7. At risk for activity intolerance Laura Werner is at risk for exercise intolerance due to obesity, shortness of breath, and fatigue.  Assessment/Plan:   1. Other fatigue [name] does feel that her weight is causing her energy to be lower than it should be. Fatigue may be related to obesity, depression or many other causes. Labs will be ordered, and in the meanwhile, [name] will focus on self care including making healthy food choices, increasing physical activity and focusing on stress reduction.  - EKG 12-Lead - Comprehensive metabolic panel - Hemoglobin A1c - Insulin, random - Lipid Panel With LDL/HDL Ratio - T3 - T4, free - TSH - VITAMIN D 25 Hydroxy (Vit-D Deficiency, Fractures)  2. SOB (shortness of breath) on  exertion [name] does feel that she gets out of breath more easily that she used to when she exercises. [name]'s shortness of breath appears to be obesity related and exercise induced. She has agreed to work on weight loss and gradually increase exercise to treat her exercise induced shortness of breath. Will  continue to monitor closely.  3. Essential hypertension Laura Werner will continue her medications, and will start working on healthy weight loss and exercise to improve blood pressure control. We will watch for signs of hypotension as she continues her lifestyle modifications. We will check labs today.  - Comprehensive metabolic panel  4. Insulin resistance Laura Werner will continue to work on weight loss, exercise, and decreasing simple carbohydrates to help decrease the risk of diabetes. She is to increase healthy fats. We will check labs today. Laura Werner agreed to follow-up with [place] as directed to closely monitor her progress.  - Hemoglobin A1c - Insulin, random  5. Attention deficit hyperactivity disorder (ADHD), predominantly inattentive type Laura Werner will continue her medications, and will continue to follow up as directed.  6. Vitamin D deficiency Low Vitamin D level contributes to fatigue and are associated with obesity, breast, and colon cancer. We will check labs today, and Laura Werner will follow-up for routine testing of Vitamin D, at least 2-3 times per year to avoid over-replacement.  - VITAMIN D 25 Hydroxy (Vit-D Deficiency, Fractures)  7. Depression screen Laura Werner had a positive depression screening. Depression is commonly associated with obesity and often results in emotional eating behaviors. We will monitor this closely and work on CBT to help improve the non-hunger eating patterns. Referral to Psychology may be required if no improvement is seen as she continues in our clinic.  8. At risk for activity intolerance Laura Werner was given approximately 15 minutes of exercise intolerance counseling today. She is 35 y.o. female and has risk factors exercise intolerance including obesity. We discussed intensive lifestyle modifications today with an emphasis on specific weight loss instructions and strategies. Laura Werner will slowly increase activity as tolerated.  Repetitive spaced learning was employed today to  elicit superior memory formation and behavioral change.  9. Class 3 severe obesity with serious comorbidity and body mass index (BMI) of 45.0 to 49.9 in adult, unspecified obesity type Laura Werner) Laura Werner is currently in the action stage of change and her goal is to continue with weight loss efforts. I recommend Laura Werner begin the structured treatment plan as follows:  She has agreed to the Category 3 Plan.  Intentional eating, and emotional versus physical hunger was discussed.  Exercise goals: No exercise has been prescribed at this time.   Behavioral modification strategies: increasing lean protein intake, decreasing simple carbohydrates, increasing vegetables, increasing water intake, decreasing eating out, no skipping meals, meal planning and cooking strategies, keeping healthy foods in the home and planning for success.  She was informed of the importance of frequent follow-up visits to maximize her success with intensive lifestyle modifications for her multiple health conditions. She was informed we would discuss her lab results at her next visit unless there is a critical issue that needs to be addressed sooner. Laura Werner agreed to keep her next visit at the agreed upon time to discuss these results.  Objective:   Blood pressure 118/66, pulse 62, temperature 97.8 F (36.6 C), height 5\' 7"  (1.702 m), weight (!) 313 lb (142 kg), last menstrual period 07/19/2020, SpO2 99 %. Body mass index is 49.02 kg/m.  EKG: Normal sinus rhythm, rate 86 BPM.  Indirect Calorimeter completed today shows  a VO2 of 294 and a REE of 2049.  Her calculated basal metabolic rate is 8832 thus her basal metabolic rate is worse than expected.  General: Cooperative, alert, well developed, in no acute distress. HEENT: Conjunctivae and lids unremarkable. Cardiovascular: Regular rhythm.  Lungs: Normal work of breathing. Neurologic: No focal deficits.   Lab Results  Component Value Date   CREATININE 0.71 08/24/2020   BUN 15  08/24/2020   NA 138 08/24/2020   K 3.4 (L) 08/24/2020   CL 97 08/24/2020   CO2 26 08/24/2020   Lab Results  Component Value Date   ALT 42 (H) 08/24/2020   AST 23 08/24/2020   ALKPHOS 130 (H) 08/24/2020   BILITOT 0.3 08/24/2020   Lab Results  Component Value Date   HGBA1C 5.6 08/24/2020   HGBA1C 5.1 08/19/2019   HGBA1C 5.2 10/14/2017   Lab Results  Component Value Date   INSULIN 21.4 08/24/2020   Lab Results  Component Value Date   TSH 3.430 08/24/2020   Lab Results  Component Value Date   CHOL 162 08/24/2020   HDL 45 08/24/2020   LDLCALC 104 (H) 08/24/2020   TRIG 68 08/24/2020   CHOLHDL 3 08/19/2019   Lab Results  Component Value Date   WBC 9.0 08/19/2019   HGB 13.3 08/19/2019   HCT 39.8 08/19/2019   MCV 81.3 08/19/2019   PLT 204.0 08/19/2019   No results found for: IRON, TIBC, FERRITIN  Attestation Statements:   Reviewed by clinician on day of visit: allergies, medications, problem list, medical history, surgical history, family history, social history, and previous encounter notes.   Wilhemena Durie, am acting as Location manager for [employer], DO.  I have reviewed the above documentation for accuracy and completeness, and I agree with the above. Jearld Lesch, DO

## 2020-09-07 ENCOUNTER — Other Ambulatory Visit: Payer: Self-pay

## 2020-09-07 ENCOUNTER — Ambulatory Visit (INDEPENDENT_AMBULATORY_CARE_PROVIDER_SITE_OTHER): Payer: 59 | Admitting: Bariatrics

## 2020-09-07 ENCOUNTER — Encounter (INDEPENDENT_AMBULATORY_CARE_PROVIDER_SITE_OTHER): Payer: Self-pay | Admitting: Bariatrics

## 2020-09-07 VITALS — BP 112/76 | HR 82 | Temp 98.0°F | Ht 67.0 in | Wt 307.0 lb

## 2020-09-07 DIAGNOSIS — E8881 Metabolic syndrome: Secondary | ICD-10-CM | POA: Diagnosis not present

## 2020-09-07 DIAGNOSIS — Z6841 Body Mass Index (BMI) 40.0 and over, adult: Secondary | ICD-10-CM

## 2020-09-07 DIAGNOSIS — E559 Vitamin D deficiency, unspecified: Secondary | ICD-10-CM

## 2020-09-07 DIAGNOSIS — I10 Essential (primary) hypertension: Secondary | ICD-10-CM

## 2020-09-07 DIAGNOSIS — Z9189 Other specified personal risk factors, not elsewhere classified: Secondary | ICD-10-CM

## 2020-09-07 MED ORDER — VITAMIN D (ERGOCALCIFEROL) 1.25 MG (50000 UNIT) PO CAPS: 50000.0000 [IU] | ORAL_CAPSULE | ORAL | 0 refills | Status: DC

## 2020-09-07 MED ORDER — METFORMIN HCL 500 MG PO TABS: 500.0000 mg | ORAL_TABLET | Freq: Every day | ORAL | 0 refills | Status: DC

## 2020-09-08 ENCOUNTER — Encounter: Payer: Self-pay | Admitting: Family Medicine

## 2020-09-08 ENCOUNTER — Ambulatory Visit (INDEPENDENT_AMBULATORY_CARE_PROVIDER_SITE_OTHER): Payer: 59 | Admitting: Family Medicine

## 2020-09-08 VITALS — BP 124/80 | HR 86 | Temp 98.0°F | Ht 67.0 in | Wt 310.8 lb

## 2020-09-08 DIAGNOSIS — E559 Vitamin D deficiency, unspecified: Secondary | ICD-10-CM | POA: Diagnosis not present

## 2020-09-08 DIAGNOSIS — F900 Attention-deficit hyperactivity disorder, predominantly inattentive type: Secondary | ICD-10-CM | POA: Diagnosis not present

## 2020-09-08 DIAGNOSIS — Z124 Encounter for screening for malignant neoplasm of cervix: Secondary | ICD-10-CM | POA: Diagnosis not present

## 2020-09-08 DIAGNOSIS — I10 Essential (primary) hypertension: Secondary | ICD-10-CM

## 2020-09-08 DIAGNOSIS — Z0000 Encounter for general adult medical examination without abnormal findings: Secondary | ICD-10-CM

## 2020-09-08 DIAGNOSIS — R7989 Other specified abnormal findings of blood chemistry: Secondary | ICD-10-CM

## 2020-09-08 NOTE — Progress Notes (Signed)
Subjective  Chief Complaint  Patient presents with  . Annual Exam  . Hypertension  . ADHD    HPI: Laura Werner is a 35 y.o. female who presents to [name] [place] today for a Female Wellness Visit.  She also has the concerns and/or needs as listed above in the chief complaint. These will be addressed in addition to the Health Maintenance Visit.   Wellness Visit: annual visit with health maintenance review and exam without Pap   HM: Patient is due for Pap smear.  She would like to reestablish with [name] for GYN care. Chronic disease management visit and/or acute problem visit:  Morbid obesity: Reviewed notes from healthy weight and wellness clinic.  Reviewed all lab work.  She seems motivated.  Hypertension: Good control on amlodipine and HCTZ 25 daily.  However potassium level is at 3.4.  No chest pain or shortness of breath.  ADD: Continue to have good control.  Uses during the weekday.  No adverse effects.  Elevated liver function test, mild.  No right upper quadrant pain.  Not been worked up in the past.  Vitamin D deficiency noted on recent lab work.  Assessment  1. Annual physical exam   2. Essential hypertension   3. Morbid obesity ([place])   4. Attention deficit hyperactivity disorder (ADHD), predominantly inattentive type   5. Vitamin D insufficiency   6. Elevated liver function tests   7. Cervical cancer screening      Plan  Female Wellness Visit:  Age appropriate Health Maintenance and Prevention measures were discussed with patient. Included topics are cancer screening recommendations, ways to keep healthy (see AVS) including dietary and exercise recommendations, regular eye and dental care, use of seat belts, and avoidance of moderate alcohol use and tobacco use.  Will refer to GYN for Pap smear and GYN female wellness care  BMI: discussed patient's BMI and encouraged positive lifestyle modifications to help get to or  maintain a target BMI.  HM needs and immunizations were addressed and ordered. See below for orders. See HM and immunization section for updates.  Up-to-date  Routine labs and screening tests ordered including cmp, cbc and lipids where appropriate.  Discussed recommendations regarding Vit D and calcium supplementation (see AVS)  Chronic disease f/u and/or acute problem visit: (deemed necessary to be done in addition to the wellness visit):  Hypertension: Good control.  Monitor potassium levels.  Patient does well with weight loss can decrease HCTZ to 12.5 daily which should help with potassium levels  ADD is well controlled  Elevated liver function test: Likely fatty liver.  Discussed etiology prognosis and management strategies.  Continue weight loss.  Will monitor.  If persist or worsen, will check lab work and ultrasound.  Patient defers lab work today since she just had it drawn with healthy weight and wellness last week.  Vitamin D deficiency: Started on supplements.  Follow up: 6 months for ADD follow-up.  Orders Placed This Encounter  Procedures  . Ambulatory referral to Obstetrics / Gynecology   No orders of the defined types were placed in this encounter.      Body mass index is 48.68 kg/m. Wt Readings from Last 3 Encounters:  09/08/20 (!) 310 lb 12.8 oz (141 kg)  09/07/20 (!) 307 lb (139.3 kg)  08/24/20 (!) 313 lb (142 kg)     Patient Active Problem List   Diagnosis Date Noted  . Vitamin D insufficiency 08/29/2020  . Elevated glucose  08/24/2020  . Attention deficit hyperactivity disorder (ADHD) 10/15/2017    Diagnosed during [facility] by Dr. Sheryn Bison. Does well with low dose Adderall Xr.    . Essential hypertension 10/15/2017  . Morbid obesity ([place]) 02/12/2013   Health Maintenance  Topic Date Due  . Hepatitis C Screening  Never done  . COVID-19 Vaccine (4 - Booster for [place] series) 05/19/2020  . TETANUS/TDAP  08/14/2020  . PAP SMEAR-Modifier   10/29/2020  . INFLUENZA VACCINE  11/28/2020  . HIV Screening  Completed  . HPV VACCINES  Aged Out   Immunization History  Administered Date(s) Administered  . Influenza,inj,Quad PF,6+ Mos 01/06/2020  . Influenza-Unspecified 02/07/2018  . PFIZER(Purple Top)SARS-COV-2 Vaccination 05/15/2019, 06/05/2019, 02/17/2020  . Tdap 08/15/2010   We updated and reviewed the patient's past history in detail and it is documented below. Allergies: Patient  reports current alcohol use. Past Medical History Patient  has a past medical history of ADHD (attention deficit hyperactivity disorder), combined type, Benign joint hypermobility, HTN (hypertension), Insulin resistance, and Obesity. Past Surgical History Patient  has a past surgical history that includes Arthroscopic repair ACL (2018). Social History   Socioeconomic History  . Marital status: Single    Spouse name: Not on file  . Number of children: Not on file  . Years of education: Not on file  . Highest education level: Master's degree (e.g., MA, MS, MEng, MEd, MSW, MBA)  Occupational History  . 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 Smoking status: Never Smoker  . Smokeless tobacco: Never Used  Vaping Use  . Vaping Use: Never used  Substance and Sexual Activity  . Alcohol use: Yes    Comment: 1-2 drinks/week or less  . Drug use: No  . Sexual activity: Not Currently    Partners: Male  Other Topics Concern  . Not on file  Social History Narrative  . Not on file   Social Determinants of Health   Financial Resource Strain: Not on file  Food Insecurity: Not on file  Transportation Needs: Not on file  Physical Activity: Not on file  Stress: Not on file  Social Connections: Not on file   Family History  Problem Relation Age of Onset  . Melanoma Maternal Grandmother   . Lung cancer Paternal Grandmother   . Lung cancer Paternal Grandfather   . Depression Mother   . Hypertension Mother    . Diabetes Mother   . Breast cancer Mother 11       triple negative  . Anxiety disorder Mother   . Obesity Mother   . Alcohol abuse Father   . Cancer Father   . Depression Father   . Obesity Father   . Hypertension Other   . Healthy Brother     Review of Systems: Constitutional: negative for fever or malaise Ophthalmic: negative for photophobia, double vision or loss of vision Cardiovascular: negative for chest pain, dyspnea on exertion, or new LE swelling Respiratory: negative for SOB or persistent cough Gastrointestinal: negative for abdominal pain, change in bowel habits or melena Genitourinary: negative for dysuria or gross hematuria, no abnormal uterine bleeding or disharge Musculoskeletal: negative for new gait disturbance or muscular weakness Integumentary: negative for new or persistent rashes, no breast lumps Neurological: negative for TIA or stroke symptoms Psychiatric: negative for SI or delusions Allergic/Immunologic: negative for hives  Patient Care Team    Relationship Specialty Notifications Start End  Leamon Arnt, MD PCP -  General Family Medicine  08/19/19     Objective  Vitals: BP 124/80   Pulse 86   Temp 98 F (36.7 C) (Temporal)   Ht 5\' 7"  (1.702 m)   Wt (!) 310 lb 12.8 oz (141 kg)   SpO2 98%   BMI 48.68 kg/m  General:  Well developed, well nourished, no acute distress  Psych:  Alert and orientedx3,normal mood and affect HEENT:  Normocephalic, atraumatic, non-icteric sclera, PERRL, supple neck without adenopathy, mass or thyromegaly Cardiovascular:  Normal S1, S2, RRR without gallop, rub or murmur Respiratory:  Good breath sounds bilaterally, CTAB with normal respiratory effort Gastrointestinal: normal bowel sounds, soft, non-tender, no noted masses. No HSM MSK: no deformities, contusions. Joints are without erythema or swelling.  Skin:  Warm, no rashes or suspicious lesions noted Neurologic:    Mental status is normal. Gross motor and sensory  exams are normal. Normal gait. No tremor    Commons side effects, risks, benefits, and alternatives for medications and treatment plan prescribed today were discussed, and the patient expressed understanding of the given instructions. Patient is instructed to call or message via MyChart if he/she has any questions or concerns regarding our treatment plan. No barriers to understanding were identified. We discussed Red Flag symptoms and signs in detail. Patient expressed understanding regarding what to do in case of urgent or emergency type symptoms.   Medication list was reconciled, printed and provided to the patient in AVS. Patient instructions and summary information was reviewed with the patient as documented in the AVS. This note was prepared with assistance of Dragon voice recognition software. Occasional wrong-word or sound-a-like substitutions may have occurred due to the inherent limitations of voice recognition software  This visit occurred during the SARS-CoV-2 public health emergency.  Safety protocols were in place, including screening questions prior to the visit, additional usage of staff PPE, and extensive cleaning of exam room while observing appropriate contact time as indicated for disinfecting solutions.

## 2020-09-08 NOTE — Progress Notes (Signed)
Chief Complaint:   OBESITY Laura Werner is here to discuss her progress with her obesity treatment plan along with follow-up of her obesity related diagnoses. Laura Werner is on the Category 3 Plan and states she is following her eating plan approximately 90% of the time. Laura Werner states she is walking for 30 minutes 3 times per week.  Today's visit was #: 2 Starting weight: 313 lbs Starting date: 08/24/2020 Today's weight: 307 lbs Today's date: 09/07/2020 Total lbs lost to date: 6 Total lbs lost since last in-office visit: 6  Interim History: Laura Werner is down 6 lbs since her last visit. She did not struggle that's much, but she struggles slightly at night.  Subjective:   1. Vitamin D insufficiency Laura Werner's Vit D level is 21.2, and she is not on Vit D supplementation.  2. Insulin resistance Laura Werner's insulin level is 21.4 and A1c 5.6.  3. Essential hypertension Laura Werner's blood pressure is controlled.   4. At risk for diabetes mellitus Laura Werner is at higher than average risk for developing diabetes due to obesity.   Assessment/Plan:   1. Vitamin D insufficiency Low Vitamin D level contributes to fatigue and are associated with obesity, breast, and colon cancer. Laura Werner agreed to start prescription Vitamin D 50,000 IU every week with no refills. She will follow-up for routine testing of Vitamin D, at least 2-3 times per year to avoid over-replacement.  - Vitamin D, Ergocalciferol, (DRISDOL) 1.25 MG (50000 UNIT) CAPS capsule; Take 1 capsule (50,000 Units total) by mouth every 7 (seven) days.  Dispense: 4 capsule; Refill: 0  2. Insulin resistance Laura Werner agreed to start metformin 500 mg 1 tablet with lunch daily, with no refills. She will continue to work on weight loss, exercise, and decreasing simple carbohydrates to help decrease the risk of diabetes. Insulin resistance and pre-diabetes handouts were given today. Laura Werner agreed to follow-up with Laura Werner as directed to closely monitor her progress.  - metFORMIN  (GLUCOPHAGE) 500 MG tablet; Take 1 tablet (500 mg total) by mouth daily with lunch.  Dispense: 30 tablet; Refill: 0  3. Essential hypertension Laura Werner will continue her medications, and will continue working on healthy weight loss and exercise to improve blood pressure control. We will watch for signs of hypotension as she continues her lifestyle modifications.  4. At risk for diabetes mellitus Laura Werner was given approximately 15 minutes of diabetes education and counseling today. We discussed intensive lifestyle modifications today with an emphasis on weight loss as well as increasing exercise and decreasing simple carbohydrates in her diet. We also reviewed medication options with an emphasis on risk versus benefit of those discussed.   Repetitive spaced learning was employed today to elicit superior memory formation and behavioral change.  5. Obesity, current BMI 48 Laura Werner is currently in the action stage of change. As such, her goal is to continue with weight loss efforts. She has agreed to the Category 3 Plan.   Intentional eating was discussed. I reviewed labs from 08/24/2020 with the patient today.  Exercise goals: As is.  Behavioral modification strategies: increasing lean protein intake, decreasing simple carbohydrates, increasing vegetables, decreasing eating out, no skipping meals, meal planning and cooking strategies, keeping healthy foods in the home and planning for success.  Laura Werner has agreed to follow-up with our clinic in 2 weeks. She was informed of the importance of frequent follow-up visits to maximize her success with intensive lifestyle modifications for her multiple health conditions.   Objective:   Blood pressure 112/76, pulse 82, temperature 98  F (36.7 C), height 5\' 7"  (1.702 m), weight (!) 307 lb (139.3 kg), SpO2 98 %. Body mass index is 48.08 kg/m.  General: Cooperative, alert, well developed, in no acute distress. HEENT: Conjunctivae and lids  unremarkable. Cardiovascular: Regular rhythm.  Lungs: Normal work of breathing. Neurologic: No focal deficits.   Lab Results  Component Value Date   CREATININE 0.71 08/24/2020   BUN 15 08/24/2020   NA 138 08/24/2020   K 3.4 (L) 08/24/2020   CL 97 08/24/2020   CO2 26 08/24/2020   Lab Results  Component Value Date   ALT 42 (H) 08/24/2020   AST 23 08/24/2020   ALKPHOS 130 (H) 08/24/2020   BILITOT 0.3 08/24/2020   Lab Results  Component Value Date   HGBA1C 5.6 08/24/2020   HGBA1C 5.1 08/19/2019   HGBA1C 5.2 10/14/2017   Lab Results  Component Value Date   INSULIN 21.4 08/24/2020   Lab Results  Component Value Date   TSH 3.430 08/24/2020   Lab Results  Component Value Date   CHOL 162 08/24/2020   HDL 45 08/24/2020   LDLCALC 104 (H) 08/24/2020   TRIG 68 08/24/2020   CHOLHDL 3 08/19/2019   Lab Results  Component Value Date   WBC 9.0 08/19/2019   HGB 13.3 08/19/2019   HCT 39.8 08/19/2019   MCV 81.3 08/19/2019   PLT 204.0 08/19/2019   No results found for: IRON, TIBC, FERRITIN  Attestation Statements:   Reviewed by clinician on day of visit: allergies, medications, problem list, medical history, surgical history, family history, social history, and previous encounter notes.   Wilhemena Durie, am acting as Location manager for [employer], DO.  I have reviewed the above documentation for accuracy and completeness, and I agree with the above. Jearld Lesch, DO

## 2020-09-08 NOTE — Patient Instructions (Signed)
Please return in 6 months for ADD recheck  Good to see you. Just send in a refill request for your next adderall RX needed.   If you have any questions or concerns, please don't hesitate to send me a message via MyChart or call the office at [phone]. Thank you for visiting with [place] today! It's our pleasure caring for you.

## 2020-09-12 ENCOUNTER — Encounter (INDEPENDENT_AMBULATORY_CARE_PROVIDER_SITE_OTHER): Payer: Self-pay | Admitting: Bariatrics

## 2020-09-19 ENCOUNTER — Other Ambulatory Visit: Payer: Self-pay | Admitting: Family Medicine

## 2020-10-04 ENCOUNTER — Ambulatory Visit (INDEPENDENT_AMBULATORY_CARE_PROVIDER_SITE_OTHER): Payer: 59 | Admitting: Family Medicine

## 2020-10-04 ENCOUNTER — Other Ambulatory Visit: Payer: Self-pay

## 2020-10-04 ENCOUNTER — Encounter (INDEPENDENT_AMBULATORY_CARE_PROVIDER_SITE_OTHER): Payer: Self-pay | Admitting: Family Medicine

## 2020-10-04 VITALS — BP 113/77 | HR 87 | Temp 97.9°F | Ht 67.0 in | Wt 305.0 lb

## 2020-10-04 DIAGNOSIS — E559 Vitamin D deficiency, unspecified: Secondary | ICD-10-CM

## 2020-10-04 DIAGNOSIS — Z9189 Other specified personal risk factors, not elsewhere classified: Secondary | ICD-10-CM | POA: Diagnosis not present

## 2020-10-04 DIAGNOSIS — R7301 Impaired fasting glucose: Secondary | ICD-10-CM | POA: Diagnosis not present

## 2020-10-04 DIAGNOSIS — I10 Essential (primary) hypertension: Secondary | ICD-10-CM | POA: Diagnosis not present

## 2020-10-04 DIAGNOSIS — F900 Attention-deficit hyperactivity disorder, predominantly inattentive type: Secondary | ICD-10-CM | POA: Diagnosis not present

## 2020-10-04 DIAGNOSIS — Z6841 Body Mass Index (BMI) 40.0 and over, adult: Secondary | ICD-10-CM

## 2020-10-05 MED ORDER — OZEMPIC (0.25 OR 0.5 MG/DOSE) 2 MG/1.5ML ~~LOC~~ SOPN: 0.2500 mg | PEN_INJECTOR | SUBCUTANEOUS | 0 refills | Status: DC

## 2020-10-05 NOTE — Progress Notes (Signed)
Chief Complaint:   OBESITY Laura Werner is here to discuss her progress with her obesity treatment plan along with follow-up of her obesity related diagnoses. See Medical Weight Management Flowsheet for bioelectrical impedance results.  Today's visit was #: 3 Starting weight: 313 lbs Starting date: 08/24/2020 Today's weight: 305 lbs Today's date: 10/04/2020 Weight change since last visit: 2 lbs Total lbs lost to date: 8 lbs Body mass index is 47.77 kg/m.  Total weight loss percentage to date: -2.56%  Interim History: Laura Werner is working the night shift from 6:30-6:30 for 4 nights.  She started metformin 2 weeks ago.  She is eating 3 meals that loosely follow the plan.  She has 300 calorie snacks.  Nutrition Plan: the Category 3 Plan for 75% of the time. Activity:  Walking for 45 minutes 4 times per week.  Assessment/Plan:   1. Impaired fasting glucose Laura Werner will start Ozempic 0.25 mg subcutaneously weekly.  - Start Semaglutide,0.25 or 0.5MG /DOS, (OZEMPIC, 0.25 OR 0.5 MG/DOSE,) 2 MG/1.5ML SOPN; Inject 0.25 mg into the skin once a week.  Dispense: 1.5 mL; Refill: 0  2. Vitamin D insufficiency Not at goal. Current vitamin D is 21.2, tested on 08/24/2020. Optimal goal > 50 ng/dL. She is taking vitamin D 50,000 IU weekly.  Plan: Continue to take prescription Vitamin D @50 ,000 IU every week as prescribed.  Follow-up for routine testing of Vitamin D, at least 2-3 times per year to avoid over-replacement.  3. Essential hypertension At goal. Medications: Norvasc 10 mg daily, HCTZ 25 mg daily.   Plan: Avoid buying foods that are: processed, frozen, or prepackaged to avoid excess salt. We will watch for signs of hypotension as she continues lifestyle modifications.  BP Readings from Last 3 Encounters:  10/04/20 113/77  09/08/20 124/80  09/07/20 112/76   Lab Results  Component Value Date   CREATININE 0.71 08/24/2020   4. Attention deficit hyperactivity disorder (ADHD), predominantly  inattentive type Laura Werner is taking Adderall 20 mg daily for ADHD. The current medical regimen is effective;  continue present plan and medications.  5. At risk for heart disease Due to Laura Werner's current state of health and medical condition(s), she is at a higher risk for heart disease.  This puts the patient at much greater risk to subsequently develop cardiopulmonary conditions that can significantly affect patient's quality of life in a negative manner.    At least 8 minutes were spent on counseling Laura Werner about these concerns today. Evidence-based interventions for health behavior change were utilized today including the discussion of self monitoring techniques, problem-solving barriers, and SMART goal setting techniques.  Specifically, regarding patient's less desirable eating habits and patterns, we employed the technique of small changes when Laura Werner has not been able to fully commit to her prudent nutritional plan.  6. Obesity, current BMI 47.8  Course: Laura Werner is currently in the action stage of change. As such, her goal is to continue with weight loss efforts.   Nutrition goals: She has agreed to the Category 3 Plan.   Exercise goals: For substantial health benefits, adults should do at least 150 minutes (2 hours and 30 minutes) a week of moderate-intensity, or 75 minutes (1 hour and 15 minutes) a week of vigorous-intensity aerobic physical activity, or an equivalent combination of moderate- and vigorous-intensity aerobic activity. Aerobic activity should be performed in episodes of at least 10 minutes, and preferably, it should be spread throughout the week.  Behavioral modification strategies: increasing lean protein intake, decreasing simple carbohydrates, and increasing  vegetables.  Laura Werner has agreed to follow-up with our clinic in 4 weeks. She was informed of the importance of frequent follow-up visits to maximize her success with intensive lifestyle modifications for her multiple health  conditions.   Objective:   Blood pressure 113/77, pulse 87, temperature 97.9 F (36.6 C), temperature source Oral, height 5\' 7"  (1.702 m), weight (!) 305 lb (138.3 kg), SpO2 98 %. Body mass index is 47.77 kg/m.  General: Cooperative, alert, well developed, in no acute distress. HEENT: Conjunctivae and lids unremarkable. Cardiovascular: Regular rhythm.  Lungs: Normal work of breathing. Neurologic: No focal deficits.   Lab Results  Component Value Date   CREATININE 0.71 08/24/2020   BUN 15 08/24/2020   NA 138 08/24/2020   K 3.4 (L) 08/24/2020   CL 97 08/24/2020   CO2 26 08/24/2020   Lab Results  Component Value Date   ALT 42 (H) 08/24/2020   AST 23 08/24/2020   ALKPHOS 130 (H) 08/24/2020   BILITOT 0.3 08/24/2020   Lab Results  Component Value Date   HGBA1C 5.6 08/24/2020   HGBA1C 5.1 08/19/2019   HGBA1C 5.2 10/14/2017   Lab Results  Component Value Date   INSULIN 21.4 08/24/2020   Lab Results  Component Value Date   TSH 3.430 08/24/2020   Lab Results  Component Value Date   CHOL 162 08/24/2020   HDL 45 08/24/2020   LDLCALC 104 (H) 08/24/2020   TRIG 68 08/24/2020   CHOLHDL 3 08/19/2019   Lab Results  Component Value Date   WBC 9.0 08/19/2019   HGB 13.3 08/19/2019   HCT 39.8 08/19/2019   MCV 81.3 08/19/2019   PLT 204.0 08/19/2019   Attestation Statements:   Reviewed by clinician on day of visit: allergies, medications, problem list, medical history, surgical history, family history, social history, and previous encounter notes.  I, Water quality scientist, CMA, am acting as transcriptionist for Briscoe Deutscher, DO  I have reviewed the above documentation for accuracy and completeness, and I agree with the above. Briscoe Deutscher, DO

## 2020-10-15 ENCOUNTER — Other Ambulatory Visit (INDEPENDENT_AMBULATORY_CARE_PROVIDER_SITE_OTHER): Payer: Self-pay | Admitting: Bariatrics

## 2020-10-15 DIAGNOSIS — E559 Vitamin D deficiency, unspecified: Secondary | ICD-10-CM

## 2020-10-17 ENCOUNTER — Other Ambulatory Visit (INDEPENDENT_AMBULATORY_CARE_PROVIDER_SITE_OTHER): Payer: Self-pay | Admitting: Bariatrics

## 2020-10-17 DIAGNOSIS — E8881 Metabolic syndrome: Secondary | ICD-10-CM

## 2020-10-17 NOTE — Telephone Encounter (Signed)
DR Wallace 

## 2020-10-18 ENCOUNTER — Ambulatory Visit (INDEPENDENT_AMBULATORY_CARE_PROVIDER_SITE_OTHER): Payer: 59 | Admitting: Family Medicine

## 2020-11-03 ENCOUNTER — Encounter (INDEPENDENT_AMBULATORY_CARE_PROVIDER_SITE_OTHER): Payer: Self-pay

## 2020-11-07 ENCOUNTER — Ambulatory Visit (INDEPENDENT_AMBULATORY_CARE_PROVIDER_SITE_OTHER): Payer: 59 | Admitting: Family Medicine

## 2020-11-23 ENCOUNTER — Ambulatory Visit (INDEPENDENT_AMBULATORY_CARE_PROVIDER_SITE_OTHER): Payer: 59 | Admitting: Family Medicine

## 2020-11-23 ENCOUNTER — Other Ambulatory Visit: Payer: Self-pay

## 2020-11-23 ENCOUNTER — Encounter (INDEPENDENT_AMBULATORY_CARE_PROVIDER_SITE_OTHER): Payer: Self-pay | Admitting: Family Medicine

## 2020-11-23 VITALS — BP 112/77 | HR 88 | Temp 97.5°F | Ht 67.0 in | Wt 305.0 lb

## 2020-11-23 DIAGNOSIS — G4726 Circadian rhythm sleep disorder, shift work type: Secondary | ICD-10-CM | POA: Diagnosis not present

## 2020-11-23 DIAGNOSIS — Z6841 Body Mass Index (BMI) 40.0 and over, adult: Secondary | ICD-10-CM

## 2020-11-23 DIAGNOSIS — I10 Essential (primary) hypertension: Secondary | ICD-10-CM | POA: Diagnosis not present

## 2020-11-23 DIAGNOSIS — Z9189 Other specified personal risk factors, not elsewhere classified: Secondary | ICD-10-CM | POA: Diagnosis not present

## 2020-11-23 DIAGNOSIS — R7301 Impaired fasting glucose: Secondary | ICD-10-CM | POA: Diagnosis not present

## 2020-11-24 MED ORDER — TIRZEPATIDE 2.5 MG/0.5ML ~~LOC~~ SOAJ: 2.5000 mg | SUBCUTANEOUS | 0 refills | Status: DC

## 2020-11-29 NOTE — Progress Notes (Signed)
Chief Complaint:   OBESITY Laura Werner is here to discuss her progress with her obesity treatment plan along with follow-up of her obesity related diagnoses.   Today's visit was #: 4 Starting weight: 313 lbs Starting date: 08/24/2020 Today's weight: 305 lbs Today's date: 08/24/2020 Weight change since last visit: 0 Total lbs lost to date: 8 lbs Body mass index is 47.77 kg/m.  Total weight loss percentage to date: -2.56%  Interim History:  Laura Werner has not had Ozepmic or metformin.  She says there has been no change in her job yet.  She is still working night shift, but she is in interviews for a change today.  Current Meal Plan: the Category 3 Plan for 50% of the time.  Current Exercise Plan: Walking for 30-60+ minutes 3 times per week.  Assessment/Plan:   Meds ordered this encounter  Medications   tirzepatide (MOUNJARO) 2.5 MG/0.5ML Pen    Sig: Inject 2.5 mg into the skin once a week.    Dispense:  2 mL    Refill:  0   1. Impaired fasting glucose After discussion, patient would like to start below medication. Expectations, risks, and potential side effects reviewed.   - Start tirzepatide [facility]) 2.5 MG/0.5ML Pen; Inject 2.5 mg into the skin once a week.  Dispense: 2 mL; Refill: 0  2. Essential hypertension At goal. Medications: Norvasc 10 mg daily, HCTZ 25 mg daily.   Plan: Avoid buying foods that are: processed, frozen, or prepackaged to avoid excess salt. We will watch for signs of hypotension as she continues lifestyle modifications.  BP Readings from Last 3 Encounters:  11/23/20 112/77  10/04/20 113/77  09/08/20 124/80   Lab Results  Component Value Date   CREATININE 0.71 08/24/2020   3. Circadian rhythm sleep disorder, shift work type Shift work refers to non-standard work schedules, including permanent or intermittent night work, early morning work, and Photographer. Shift work disorder is the development of sleep disturbances and impairment of waking  alertness and performance.   4. At risk for heart disease Due to Laura Werner's current state of health and medical condition(s), she is at a higher risk for heart disease.   This puts the patient at much greater risk to subsequently develop cardiopulmonary conditions that can significantly affect patient's quality of life in a negative manner as well.    At least 9 minutes was spent on counseling Laura Werner about these concerns today. Initial goal is to lose at least 5-10% of starting weight to help reduce these risk factors.  We will continue to reassess these conditions on a fairly regular basis in an attempt to decrease patient's overall morbidity and mortality.  Evidence-based interventions for health behavior change were utilized today including the discussion of self monitoring techniques, problem-solving barriers and SMART goal setting techniques.  Specifically regarding patient's less desirable eating habits and patterns, we employed the technique of small changes when Laura Werner has not been able to fully commit to her prudent nutritional plan.  5. Obesity, current BMI 47.8  Course: Laura Werner is currently in the action stage of change. As such, her goal is to continue with weight loss efforts.   Nutrition goals: She has agreed to the Category 3 Plan.   Exercise goals:  As is.  Behavioral modification strategies: increasing lean protein intake, decreasing simple carbohydrates, increasing vegetables, increasing water intake, and decreasing liquid calories.  Laura Werner has agreed to follow-up with our clinic in 4 weeks. She was informed of the importance of  frequent follow-up visits to maximize her success with intensive lifestyle modifications for her multiple health conditions.   Objective:   Blood pressure 112/77, pulse 88, temperature (!) 97.5 F (36.4 C), temperature source Oral, height '5\' 7"'$  (1.702 m), weight (!) 305 lb (138.3 kg), SpO2 98 %. Body mass index is 47.77 kg/m.  General: Cooperative,  alert, well developed, in no acute distress. HEENT: Conjunctivae and lids unremarkable. Cardiovascular: Regular rhythm.  Lungs: Normal work of breathing. Neurologic: No focal deficits.   Lab Results  Component Value Date   CREATININE 0.71 08/24/2020   BUN 15 08/24/2020   NA 138 08/24/2020   K 3.4 (L) 08/24/2020   CL 97 08/24/2020   CO2 26 08/24/2020   Lab Results  Component Value Date   ALT 42 (H) 08/24/2020   AST 23 08/24/2020   ALKPHOS 130 (H) 08/24/2020   BILITOT 0.3 08/24/2020   Lab Results  Component Value Date   HGBA1C 5.6 08/24/2020   HGBA1C 5.1 08/19/2019   HGBA1C 5.2 10/14/2017   Lab Results  Component Value Date   INSULIN 21.4 08/24/2020   Lab Results  Component Value Date   TSH 3.430 08/24/2020   Lab Results  Component Value Date   CHOL 162 08/24/2020   HDL 45 08/24/2020   LDLCALC 104 (H) 08/24/2020   TRIG 68 08/24/2020   CHOLHDL 3 08/19/2019   Lab Results  Component Value Date   VD25OH 21.2 (L) 08/24/2020   Lab Results  Component Value Date   WBC 9.0 08/19/2019   HGB 13.3 08/19/2019   HCT 39.8 08/19/2019   MCV 81.3 08/19/2019   PLT 204.0 08/19/2019   Attestation Statements:   Reviewed by clinician on day of visit: allergies, medications, problem list, medical history, surgical history, family history, social history, and previous encounter notes.  I, Water quality scientist, CMA, am acting as transcriptionist for Briscoe Deutscher, DO  I have reviewed the above documentation for accuracy and completeness, and I agree with the above. Briscoe Deutscher, DO

## 2020-12-11 ENCOUNTER — Encounter (INDEPENDENT_AMBULATORY_CARE_PROVIDER_SITE_OTHER): Payer: Self-pay | Admitting: Family Medicine

## 2020-12-12 MED ORDER — TIRZEPATIDE 5 MG/0.5ML ~~LOC~~ SOAJ: 5.0000 mg | SUBCUTANEOUS | 0 refills | Status: DC

## 2020-12-12 NOTE — Telephone Encounter (Signed)
Last OV with Dr Wallace 

## 2020-12-13 ENCOUNTER — Encounter (INDEPENDENT_AMBULATORY_CARE_PROVIDER_SITE_OTHER): Payer: Self-pay

## 2020-12-26 ENCOUNTER — Ambulatory Visit (INDEPENDENT_AMBULATORY_CARE_PROVIDER_SITE_OTHER): Payer: 59 | Admitting: Family Medicine

## 2020-12-26 ENCOUNTER — Encounter (INDEPENDENT_AMBULATORY_CARE_PROVIDER_SITE_OTHER): Payer: Self-pay | Admitting: Family Medicine

## 2020-12-26 ENCOUNTER — Other Ambulatory Visit: Payer: Self-pay

## 2020-12-26 VITALS — BP 108/73 | HR 72 | Temp 97.8°F | Ht 67.0 in | Wt 301.0 lb

## 2020-12-26 DIAGNOSIS — Z9189 Other specified personal risk factors, not elsewhere classified: Secondary | ICD-10-CM

## 2020-12-26 DIAGNOSIS — R7989 Other specified abnormal findings of blood chemistry: Secondary | ICD-10-CM | POA: Diagnosis not present

## 2020-12-26 DIAGNOSIS — F900 Attention-deficit hyperactivity disorder, predominantly inattentive type: Secondary | ICD-10-CM

## 2020-12-26 DIAGNOSIS — G4726 Circadian rhythm sleep disorder, shift work type: Secondary | ICD-10-CM | POA: Diagnosis not present

## 2020-12-26 DIAGNOSIS — R7301 Impaired fasting glucose: Secondary | ICD-10-CM | POA: Diagnosis not present

## 2020-12-26 DIAGNOSIS — E66813 Obesity, class 3: Secondary | ICD-10-CM

## 2020-12-26 DIAGNOSIS — Z6841 Body Mass Index (BMI) 40.0 and over, adult: Secondary | ICD-10-CM

## 2020-12-26 MED ORDER — TIRZEPATIDE 5 MG/0.5ML ~~LOC~~ SOAJ: 5.0000 mg | SUBCUTANEOUS | 0 refills | Status: DC

## 2020-12-26 NOTE — Progress Notes (Signed)
Chief Complaint:   OBESITY Laura Werner is here to discuss her progress with her obesity treatment plan along with follow-up of her obesity related diagnoses. See Medical Weight Management Flowsheet for complete bioelectrical impedance results.  Today's visit was #: 5 Starting weight: 313 lbs Starting date: 08/24/2020 Weight change since last visit: 4 lbs Total lbs lost to date: 12 lbs Total weight loss percentage to date: -3.83%  Nutrition Plan: Category 3 Plan for 80-85% of the time. Activity: Walking for 30 minutes 4 times per week. Anti-obesity medications: Mounjaro 5 mg subcutaneously weekly. Reported side effects: None.  Interim History:  Laura Werner says that Laura Werner is controlling her appetite.  She endorses constipation.  She reports drinking a gallon of water per day.  Labs obtained recently for life insurance. Her A1c is 5.1, LFTs are elevated (<50), and everything else is normal.  Assessment/Plan:   1. Impaired fasting glucose Laura Werner is taking Mounjaro 5 mg subcutaneously weekly. The current medical regimen is effective;  continue present plan and medications.  - Refill tirzepatide (MOUNJARO) 5 MG/0.5ML Pen; Inject 5 mg into the skin once a week.  Dispense: 6 mL; Refill: 0  2. Elevated liver function tests Elevated liver transaminases with an ALT predominance combined with obesity and insulin resistance is characteristic, but not diagnostic of non-alcoholic fatty liver disease (NAFLD). NAFLD is the 2nd leading cause of liver transplant in adults. Treatment includes weight loss, elimination of sweet drinks, including juice, avoidance of high fructose corn syrup, and exercise. As always, avoiding alcohol consumption is important.  Lab Results  Component Value Date   ALT 42 (H) 08/24/2020   AST 23 08/24/2020   ALKPHOS 130 (H) 08/24/2020   BILITOT 0.3 08/24/2020   3. Circadian rhythm sleep disorder, shift work type Shift work refers to non-standard work schedules, including  permanent or intermittent night work, early morning work, and Photographer. Shift work disorder is the development of sleep disturbances and impairment of waking alertness and performance. She will continue to focus on protein-rich, low simple carbohydrate foods. We reviewed the importance of hydration, regular exercise for stress reduction, and restorative sleep.   4. Attention deficit hyperactivity disorder (ADHD), predominantly inattentive type Laura Werner is taking Adderall 20 mg daily for ADHD. The current medical regimen is effective;  continue present plan and medications.  5. At risk for deficient intake of food Laura Werner was given extensive education and counseling today of more than 8 minutes on risks associated with deficient food intake.  Counseled her on the importance of following our prescribed meal plan and eating adequate amounts of protein.  Discussed with Laura Werner that inadequate food intake over longer periods of time can slow their metabolism down significantly.   6. Obesity, current BMI 47.1  Course: Laura Werner is currently in the action stage of change. As such, her goal is to continue with weight loss efforts.   Nutrition goals: She has agreed to practicing portion control and making smarter food choices, such as increasing vegetables and decreasing simple carbohydrates.   Exercise goals:  As is.  Behavioral modification strategies: increasing lean protein intake, decreasing simple carbohydrates, increasing vegetables, decreasing sodium intake, and increasing high fiber foods.  Laura Werner has agreed to follow-up with our clinic in 4 weeks. She was informed of the importance of frequent follow-up visits to maximize her success with intensive lifestyle modifications for her multiple health conditions.   Objective:   Blood pressure 108/73, pulse 72, temperature 97.8 F (36.6 C), temperature source Oral, height 5'  7" (1.702 m), weight (!) 301 lb (136.5 kg), SpO2 98 %. Body mass index is  47.14 kg/m.  General: Cooperative, alert, well developed, in no acute distress. HEENT: Conjunctivae and lids unremarkable. Cardiovascular: Regular rhythm.  Lungs: Normal work of breathing. Neurologic: No focal deficits.   Lab Results  Component Value Date   CREATININE 0.71 08/24/2020   BUN 15 08/24/2020   NA 138 08/24/2020   K 3.4 (L) 08/24/2020   CL 97 08/24/2020   CO2 26 08/24/2020   Lab Results  Component Value Date   ALT 42 (H) 08/24/2020   AST 23 08/24/2020   ALKPHOS 130 (H) 08/24/2020   BILITOT 0.3 08/24/2020   Lab Results  Component Value Date   HGBA1C 5.6 08/24/2020   HGBA1C 5.1 08/19/2019   HGBA1C 5.2 10/14/2017   Lab Results  Component Value Date   INSULIN 21.4 08/24/2020   Lab Results  Component Value Date   TSH 3.430 08/24/2020   Lab Results  Component Value Date   CHOL 162 08/24/2020   HDL 45 08/24/2020   LDLCALC 104 (H) 08/24/2020   TRIG 68 08/24/2020   CHOLHDL 3 08/19/2019   Lab Results  Component Value Date   VD25OH 21.2 (L) 08/24/2020   Lab Results  Component Value Date   WBC 9.0 08/19/2019   HGB 13.3 08/19/2019   HCT 39.8 08/19/2019   MCV 81.3 08/19/2019   PLT 204.0 08/19/2019   Attestation Statements:   Reviewed by clinician on day of visit: allergies, medications, problem list, medical history, surgical history, family history, social history, and previous encounter notes.  I, Water quality scientist, CMA, am acting as transcriptionist for Briscoe Deutscher, DO  I have reviewed the above documentation for accuracy and completeness, and I agree with the above. Briscoe Deutscher, DO

## 2021-01-10 ENCOUNTER — Other Ambulatory Visit: Payer: Self-pay | Admitting: Family Medicine

## 2021-01-10 DIAGNOSIS — F909 Attention-deficit hyperactivity disorder, unspecified type: Secondary | ICD-10-CM

## 2021-01-10 MED ORDER — AMPHETAMINE-DEXTROAMPHET ER 20 MG PO CP24: 20.0000 mg | ORAL_CAPSULE | Freq: Every day | ORAL | 0 refills | Status: DC

## 2021-01-18 ENCOUNTER — Encounter (INDEPENDENT_AMBULATORY_CARE_PROVIDER_SITE_OTHER): Payer: Self-pay | Admitting: Family Medicine

## 2021-01-18 DIAGNOSIS — R7301 Impaired fasting glucose: Secondary | ICD-10-CM

## 2021-01-18 NOTE — Telephone Encounter (Signed)
Last OV with Dr Wallace 

## 2021-01-19 ENCOUNTER — Encounter (INDEPENDENT_AMBULATORY_CARE_PROVIDER_SITE_OTHER): Payer: Self-pay

## 2021-01-19 MED ORDER — TIRZEPATIDE 5 MG/0.5ML ~~LOC~~ SOAJ: 5.0000 mg | SUBCUTANEOUS | 0 refills | Status: DC

## 2021-01-26 ENCOUNTER — Encounter (INDEPENDENT_AMBULATORY_CARE_PROVIDER_SITE_OTHER): Payer: Self-pay | Admitting: Adult Health

## 2021-01-26 ENCOUNTER — Other Ambulatory Visit: Payer: Self-pay

## 2021-01-26 ENCOUNTER — Ambulatory Visit (INDEPENDENT_AMBULATORY_CARE_PROVIDER_SITE_OTHER): Payer: 59 | Admitting: Adult Health

## 2021-01-26 VITALS — BP 112/80 | HR 87 | Temp 97.9°F | Ht 67.0 in | Wt 292.0 lb

## 2021-01-26 DIAGNOSIS — E559 Vitamin D deficiency, unspecified: Secondary | ICD-10-CM | POA: Diagnosis not present

## 2021-01-26 DIAGNOSIS — Z9189 Other specified personal risk factors, not elsewhere classified: Secondary | ICD-10-CM | POA: Diagnosis not present

## 2021-01-26 DIAGNOSIS — R7301 Impaired fasting glucose: Secondary | ICD-10-CM | POA: Diagnosis not present

## 2021-01-26 DIAGNOSIS — Z6841 Body Mass Index (BMI) 40.0 and over, adult: Secondary | ICD-10-CM

## 2021-01-26 DIAGNOSIS — I10 Essential (primary) hypertension: Secondary | ICD-10-CM | POA: Diagnosis not present

## 2021-01-26 MED ORDER — AMLODIPINE BESYLATE 5 MG PO TABS: ORAL_TABLET | ORAL | 0 refills | Status: DC

## 2021-01-26 NOTE — Progress Notes (Signed)
Chief Complaint:   OBESITY Laura Werner is here to discuss her progress with her obesity treatment plan along with follow-up of her obesity related diagnoses. [name] is on practicing portion control and making smarter food choices, such as increasing vegetables and decreasing simple carbohydrates and states she is following her eating plan approximately 0% of the time. [name] states she is walking for 30 minutes 4-5 times per week.  Today's visit was #: 6 Starting weight: 313 lbs Starting date: 08/24/2020 Today's weight: 292 lbs Today's date: 01/26/2021 Total lbs lost to date: 21 lbs Total lbs lost since last in-office visit: 9 lbs  Interim History: [name] was started on [facility] on 11/23/2020 - currently on 5 mg once weekly - 5th week on this dose. She has been very challenged to eat more than 1 meal per day (cheese and eggs, taco soup, chicken breast with broccoli, salads). She has experienced decreased appetite with the Mounjaro 5mg  dose. She works with [employer] night shift- 12 hour shifts/4 days per week, often more due to low staffing.  Subjective:   1. Impaired fasting glucose On 08/24/2020, BG 112, A1c 5.6, insulin level 26.4. Family history of T2D in her mother. She has been on metformin for 1 month - experienced diarrhea. She was given a prescription for Ozempic, however never started the GLP-1. [name] was started on [facility] on 11/23/2020 - currently on 5 mg once weekly - 5th week on this dose. She has been very challenged to eat more than 1 meal per day (cheese and eggs, taco soup, chicken breast with broccoli, salads). She has experienced decreased appetite with the Mounjaro 5mg  dose  2. Vitamin D insufficiency Vitamin D level on 21.2 on 08/24/2020. She is currently taking ergocalciferol.  She denies nausea, vomiting or muscle weakness.  Lab Results  Component Value Date   VD25OH 21.2 (L) 08/24/2020   3. Essential hypertension She has been of amlodipine 10 mg for  over 4 days - BP today 112/50, HR 87. She has continued HCTZ 25 mg daily.  She denies acute cardiac symptoms.  4. At risk for deficient intake of food The patient is at a higher than average risk of deficient intake of food due to GIP/GLP-1.   Assessment/Plan:   1. Impaired fasting glucose Check labs at next office visit.  2. Vitamin D insufficiency Check labs at next office visit.  3. Essential hypertension Check BP/HR at home - bring log to next office visit. Decrease amlodipine to 5 mg daily, continue HCTZ 25 mg daily. [name] is working on healthy weight loss and exercise to improve blood pressure control. We will watch for signs of hypotension as she continues her lifestyle modifications.  - Decrease amLODipine (NORVASC) 5 MG tablet; TAKE 1 TABLET(10 MG) BY MOUTH DAILY  Dispense: 90 tablet; Refill: 0 She has plenty of Amlodipine 10mg  tablets- simply take 1/2 tab (5mg ) once per day.  4. At risk for deficient intake of food [name] was given approximately 15 minutes of deficit intake of food prevention counseling today. [name] is at risk for eating too few calories based on current food recall. She was encouraged to focus on meeting caloric and protein goals according to her recommended meal plan.    5. Obesity, current BMI 45.7  [name] is currently in the action stage of change. As such, her goal is to continue with weight loss efforts. She has agreed to following a lower carbohydrate, vegetable and lean protein rich diet plan.   Check  fasting labs at next office visit.  Exercise goals:  As is.  Behavioral modification strategies: increasing lean protein intake, decreasing simple carbohydrates, no skipping meals, meal planning and cooking strategies, keeping healthy foods in the home, and planning for success.  [name] has agreed to follow-up with our clinic in 2 weeks. She was informed of the importance of frequent follow-up visits to maximize her success with intensive lifestyle  modifications for her multiple health conditions.   Objective:   Blood pressure 112/80, pulse 87, temperature 97.9 F (36.6 C), height 5\' 7"  (1.702 m), weight 292 lb (132.5 kg), SpO2 98 %. Body mass index is 45.73 kg/m.  General: Cooperative, alert, well developed, in no acute distress. HEENT: Conjunctivae and lids unremarkable. Cardiovascular: Regular rhythm.  Lungs: Normal work of breathing. Neurologic: No focal deficits.   Lab Results  Component Value Date   CREATININE 0.71 08/24/2020   BUN 15 08/24/2020   NA 138 08/24/2020   K 3.4 (L) 08/24/2020   CL 97 08/24/2020   CO2 26 08/24/2020   Lab Results  Component Value Date   ALT 42 (H) 08/24/2020   AST 23 08/24/2020   ALKPHOS 130 (H) 08/24/2020   BILITOT 0.3 08/24/2020   Lab Results  Component Value Date   HGBA1C 5.6 08/24/2020   HGBA1C 5.1 08/19/2019   HGBA1C 5.2 10/14/2017   Lab Results  Component Value Date   INSULIN 21.4 08/24/2020   Lab Results  Component Value Date   TSH 3.430 08/24/2020   Lab Results  Component Value Date   CHOL 162 08/24/2020   HDL 45 08/24/2020   LDLCALC 104 (H) 08/24/2020   TRIG 68 08/24/2020   CHOLHDL 3 08/19/2019   Lab Results  Component Value Date   VD25OH 21.2 (L) 08/24/2020   Lab Results  Component Value Date   WBC 9.0 08/19/2019   HGB 13.3 08/19/2019   HCT 39.8 08/19/2019   MCV 81.3 08/19/2019   PLT 204.0 08/19/2019   Attestation Statements:   Reviewed by clinician on day of visit: allergies, medications, problem list, medical history, surgical history, family history, social history, and previous encounter notes.  I, Water quality scientist, CMA, am acting as Location manager for Mina Marble, NP.  I have reviewed the above documentation for accuracy and completeness, and I agree with the above. -  Aiyonna Lucado d. [place] Lapaglia,NP-C

## 2021-02-08 ENCOUNTER — Encounter (INDEPENDENT_AMBULATORY_CARE_PROVIDER_SITE_OTHER): Payer: Self-pay | Admitting: Family Medicine

## 2021-02-08 ENCOUNTER — Other Ambulatory Visit (INDEPENDENT_AMBULATORY_CARE_PROVIDER_SITE_OTHER): Payer: Self-pay | Admitting: Adult Health

## 2021-02-08 DIAGNOSIS — R7301 Impaired fasting glucose: Secondary | ICD-10-CM

## 2021-02-08 MED ORDER — TIRZEPATIDE 5 MG/0.5ML ~~LOC~~ SOAJ: 5.0000 mg | SUBCUTANEOUS | 0 refills | Status: DC

## 2021-02-08 NOTE — Telephone Encounter (Signed)
Last OV with Katy 

## 2021-02-08 NOTE — Telephone Encounter (Signed)
LAST APPOINTMENT DATE: 01/26/21 NEXT APPOINTMENT DATE: 02/20/21   [facility] [place], [address] AT [address] [place] [address] Phone: [phone] Fax: [phone]  Patient is requesting a refill of the following medications: No prescriptions requested or ordered in this encounter   Date last filled: 09/22 Previously prescribed by Dr.Wallace  Lab Results      Component                Value               Date                      HGBA1C                   5.6                 08/24/2020                HGBA1C                   5.1                 08/19/2019                HGBA1C                   5.2                 10/14/2017           Lab Results      Component                Value               Date                      LDLCALC                  104 (H)             08/24/2020                CREATININE               0.71                08/24/2020           Lab Results      Component                Value               Date                      VD25OH                   21.2 (L)            08/24/2020            BP Readings from Last 3 Encounters: 01/26/21 : 112/80 12/26/20 : 108/73 11/23/20 : 112/77

## 2021-02-13 ENCOUNTER — Encounter (INDEPENDENT_AMBULATORY_CARE_PROVIDER_SITE_OTHER): Payer: Self-pay

## 2021-02-19 ENCOUNTER — Encounter (INDEPENDENT_AMBULATORY_CARE_PROVIDER_SITE_OTHER): Payer: Self-pay

## 2021-02-20 ENCOUNTER — Ambulatory Visit (INDEPENDENT_AMBULATORY_CARE_PROVIDER_SITE_OTHER): Payer: 59 | Admitting: Family Medicine

## 2021-02-22 ENCOUNTER — Encounter (INDEPENDENT_AMBULATORY_CARE_PROVIDER_SITE_OTHER): Payer: Self-pay | Admitting: Family Medicine

## 2021-02-22 ENCOUNTER — Ambulatory Visit (INDEPENDENT_AMBULATORY_CARE_PROVIDER_SITE_OTHER): Payer: 59 | Admitting: Family Medicine

## 2021-02-22 ENCOUNTER — Other Ambulatory Visit: Payer: Self-pay

## 2021-02-22 VITALS — BP 114/76 | HR 82 | Temp 97.9°F | Ht 67.0 in | Wt 291.0 lb

## 2021-02-22 DIAGNOSIS — E7800 Pure hypercholesterolemia, unspecified: Secondary | ICD-10-CM

## 2021-02-22 DIAGNOSIS — E559 Vitamin D deficiency, unspecified: Secondary | ICD-10-CM | POA: Diagnosis not present

## 2021-02-22 DIAGNOSIS — I10 Essential (primary) hypertension: Secondary | ICD-10-CM

## 2021-02-22 DIAGNOSIS — R7301 Impaired fasting glucose: Secondary | ICD-10-CM

## 2021-02-22 DIAGNOSIS — E66813 Obesity, class 3: Secondary | ICD-10-CM

## 2021-02-22 DIAGNOSIS — Z6841 Body Mass Index (BMI) 40.0 and over, adult: Secondary | ICD-10-CM

## 2021-02-22 MED ORDER — TIRZEPATIDE 7.5 MG/0.5ML ~~LOC~~ SOAJ: 7.5000 mg | SUBCUTANEOUS | 1 refills | Status: DC

## 2021-02-23 LAB — COMPREHENSIVE METABOLIC PANEL
ALT: 38 IU/L — ABNORMAL HIGH (ref 0–32)
AST: 24 IU/L (ref 0–40)
Albumin/Globulin Ratio: 2.1 (ref 1.2–2.2)
Albumin: 4.1 g/dL (ref 3.8–4.8)
Alkaline Phosphatase: 119 IU/L (ref 44–121)
BUN/Creatinine Ratio: 19 (ref 9–23)
BUN: 15 mg/dL (ref 6–20)
Bilirubin Total: 0.4 mg/dL (ref 0.0–1.2)
CO2: 25 mmol/L (ref 20–29)
Calcium: 9.2 mg/dL (ref 8.7–10.2)
Chloride: 99 mmol/L (ref 96–106)
Creatinine, Ser: 0.77 mg/dL (ref 0.57–1.00)
Globulin, Total: 2 g/dL (ref 1.5–4.5)
Glucose: 88 mg/dL (ref 70–99)
Potassium: 3.9 mmol/L (ref 3.5–5.2)
Sodium: 139 mmol/L (ref 134–144)
Total Protein: 6.1 g/dL (ref 6.0–8.5)
eGFR: 104 mL/min/{1.73_m2} (ref >59)

## 2021-02-23 LAB — LIPID PANEL
Chol/HDL Ratio: 3.5 ratio (ref 0.0–4.4)
Cholesterol, Total: 155 mg/dL (ref 100–199)
HDL: 44 mg/dL (ref >39)
LDL Chol Calc (NIH): 97 mg/dL (ref 0–99)
Triglycerides: 69 mg/dL (ref 0–149)
VLDL Cholesterol Cal: 14 mg/dL (ref 5–40)

## 2021-02-23 LAB — CBC WITH DIFFERENTIAL/PLATELET
Basophils Absolute: 0 10*3/uL (ref 0.0–0.2)
Basos: 0 %
EOS (ABSOLUTE): 0.1 10*3/uL (ref 0.0–0.4)
Eos: 1 %
Hematocrit: 42.6 % (ref 34.0–46.6)
Hemoglobin: 14.2 g/dL (ref 11.1–15.9)
Immature Grans (Abs): 0 10*3/uL (ref 0.0–0.1)
Immature Granulocytes: 0 %
Lymphocytes Absolute: 2.1 10*3/uL (ref 0.7–3.1)
Lymphs: 25 %
MCH: 26.5 pg — ABNORMAL LOW (ref 26.6–33.0)
MCHC: 33.3 g/dL (ref 31.5–35.7)
MCV: 80 fL (ref 79–97)
Monocytes Absolute: 0.4 10*3/uL (ref 0.1–0.9)
Monocytes: 5 %
Neutrophils Absolute: 5.8 10*3/uL (ref 1.4–7.0)
Neutrophils: 69 %
Platelets: 244 10*3/uL (ref 150–450)
RBC: 5.36 x10E6/uL — ABNORMAL HIGH (ref 3.77–5.28)
RDW: 13.8 % (ref 11.7–15.4)
WBC: 8.4 10*3/uL (ref 3.4–10.8)

## 2021-02-23 LAB — INSULIN, RANDOM: INSULIN: 18.8 u[IU]/mL (ref 2.6–24.9)

## 2021-02-23 LAB — VITAMIN D 25 HYDROXY (VIT D DEFICIENCY, FRACTURES): Vit D, 25-Hydroxy: 26.4 ng/mL — ABNORMAL LOW (ref 30.0–100.0)

## 2021-02-23 NOTE — Progress Notes (Signed)
Chief Complaint:   OBESITY Laura Werner is here to discuss her progress with her obesity treatment plan along with follow-up of her obesity related diagnoses. See Medical Weight Management Flowsheet for complete bioelectrical impedance results.  Today's visit was #: 7 Starting weight: 313 lbs Starting date: 08/24/2020 Weight change since last visit: 1 lb Total lbs lost to date: 22 lbs Total weight loss percentage to date: -7.03%  Nutrition Plan:  Lower carbohydrate, vegetable and lean protein rich diet plan for 0 of the time. Activity: Walking for 30-60 minutes 3-4 times per week.  Anti-obesity medications: Mounjaro 5 mg subcutaneously weekly. Reported side effects: None.  Interim History: Laura Werner says she is doing well.  She has had some increased polyphagia.  Assessment/Plan:   1. Impaired fasting glucose, with polyphagia Worsening. Current treatment: Mounjaro 5 mg subcutaneously weekly. She will continue to focus on protein-rich, low simple carbohydrate foods. We reviewed the importance of hydration, regular exercise for stress reduction, and restorative sleep.  Plan:  Increase Mounjaro to 7.5 mg subcutaneously weekly, as per below.  Will check insulin level today.  - Insulin, random - Increase tirzepatide (MOUNJARO) 7.5 MG/0.5ML Pen; Inject 7.5 mg into the skin once a week.  Dispense: 2 mL; Refill: 1  2. Vitamin D insufficiency Not at goal.  She is not taking a vitamin D supplement.  Plan:  Will check vitamin D level today.  Lab Results  Component Value Date   VD25OH 21.2 (L) 08/24/2020   - VITAMIN D 25 Hydroxy (Vit-D Deficiency, Fractures)  3. Essential hypertension At goal. Medications: Norvasc 10 mg daily, HCTZ 25 mg daily.   Plan: Avoid buying foods that are: processed, frozen, or prepackaged to avoid excess salt. We will watch for signs of hypotension as she continues lifestyle modifications.  Check labs today.  BP Readings from Last 3 Encounters:  02/22/21 114/76   01/26/21 112/80  12/26/20 108/73   - Comprehensive metabolic panel - CBC with Differential/Platelet  4. Elevated LDL cholesterol level Course: Controlled. Lipid-lowering medications: None.   Plan: Dietary changes: Increase soluble fiber, decrease simple carbohydrates, decrease saturated fat. Exercise changes: Moderate to vigorous-intensity aerobic activity 150 minutes per week or as tolerated. We will continue to monitor along with PCP/specialists as it pertains to her weight loss journey. Will check lipid panel today.  - Lipid panel  5. Obesity, current BMI 45.6  Course: [name] is currently in the action stage of change. As such, her goal is to continue with weight loss efforts.   Nutrition goals: She has agreed to following a lower carbohydrate, vegetable and lean protein rich diet plan.   Exercise goals:  As is.  Behavioral modification strategies: increasing lean protein intake, decreasing simple carbohydrates, increasing vegetables, increasing water intake, and decreasing sodium intake.  [name] has agreed to follow-up with our clinic in 4 weeks. She was informed of the importance of frequent follow-up visits to maximize her success with intensive lifestyle modifications for her multiple health conditions.   Objective:   Blood pressure 114/76, pulse 82, temperature 97.9 F (36.6 C), temperature source Oral, height 5\' 7"  (1.702 m), weight 291 lb (132 kg), SpO2 96 %. Body mass index is 45.58 kg/m.  General: Cooperative, alert, well developed, in no acute distress. HEENT: Conjunctivae and lids unremarkable. Cardiovascular: Regular rhythm.  Lungs: Normal work of breathing. Neurologic: No focal deficits.   Lab Results  Component Value Date   HGBA1C 5.6 08/24/2020   HGBA1C 5.1 08/19/2019   HGBA1C 5.2 10/14/2017  Lab Results  Component Value Date   INSULIN 21.4 08/24/2020   Lab Results  Component Value Date   TSH 3.430 08/24/2020   Lab Results  Component Value Date    VD25OH 21.2 (L) 08/24/2020   Attestation Statements:   Reviewed by clinician on day of visit: allergies, medications, problem list, medical history, surgical history, family history, social history, and previous encounter notes.  I, Water quality scientist, CMA, am acting as transcriptionist for Briscoe Deutscher, DO  I have reviewed the above documentation for accuracy and completeness, and I agree with the above. -  Briscoe Deutscher, DO, MS, FAAFP, DABOM - Family and Bariatric Medicine.

## 2021-03-08 ENCOUNTER — Other Ambulatory Visit: Payer: Self-pay

## 2021-03-08 ENCOUNTER — Ambulatory Visit (INDEPENDENT_AMBULATORY_CARE_PROVIDER_SITE_OTHER): Payer: 59 | Admitting: Adult Health

## 2021-03-08 ENCOUNTER — Encounter (INDEPENDENT_AMBULATORY_CARE_PROVIDER_SITE_OTHER): Payer: Self-pay | Admitting: Adult Health

## 2021-03-08 VITALS — BP 110/74 | HR 100 | Temp 98.0°F | Ht 67.0 in | Wt 286.0 lb

## 2021-03-08 DIAGNOSIS — Z6841 Body Mass Index (BMI) 40.0 and over, adult: Secondary | ICD-10-CM

## 2021-03-08 DIAGNOSIS — E559 Vitamin D deficiency, unspecified: Secondary | ICD-10-CM

## 2021-03-08 DIAGNOSIS — Z9189 Other specified personal risk factors, not elsewhere classified: Secondary | ICD-10-CM | POA: Diagnosis not present

## 2021-03-08 DIAGNOSIS — E66813 Obesity, class 3: Secondary | ICD-10-CM

## 2021-03-08 DIAGNOSIS — E7800 Pure hypercholesterolemia, unspecified: Secondary | ICD-10-CM | POA: Diagnosis not present

## 2021-03-08 DIAGNOSIS — I10 Essential (primary) hypertension: Secondary | ICD-10-CM | POA: Diagnosis not present

## 2021-03-08 DIAGNOSIS — R7301 Impaired fasting glucose: Secondary | ICD-10-CM | POA: Diagnosis not present

## 2021-03-08 MED ORDER — VITAMIN D (ERGOCALCIFEROL) 1.25 MG (50000 UNIT) PO CAPS: 50000.0000 [IU] | ORAL_CAPSULE | ORAL | 0 refills | Status: DC

## 2021-03-08 NOTE — Progress Notes (Signed)
Chief Complaint:   OBESITY Laura Werner is here to discuss her progress with her obesity treatment plan along with follow-up of her obesity related diagnoses. Laura Werner is on following a lower carbohydrate, vegetable and lean protein rich diet plan and states she is following her eating plan approximately 100% of the time. Laura Werner states she is not exercising regularly at this time.  Today's visit was #: 8 Starting weight: 313 lbs Starting date: 08/24/2020 Today's weight: 286 lbs Today's date: 03/08/2021 Total lbs lost to date: 27 lbs Total lbs lost since last in-office visit: 5 lbs  Interim History: Due to worsening polyphagia, Laura Werner's Mounjaro dose was increased to 7.5 mg at last office visit on 02/22/2021, however, she remained on 5 mg due to home supply.  She injects on Sunday - will take final 5mg  this Sunday.     Subjective:   1. Impaired fasting glucose, with polyphagia Discussed labs with patient today.  Due to worsening polyphagia, Laura Werner's Mounjaro dose was increased to 7.5 mg at last office visit on 02/22/2021, however, she remained on 5 mg due to home supply.  She injects on Sunday - will take final 5mg  this Sunday.   On 02/22/2021, BG 88, insulin 18.8.  2. Vitamin D insufficiency Discussed labs with patient today.  On 02/22/2021, vitamin D level - 26.4 - well below goal of 50. She is not on any vitamin D supplement.  3. Essential hypertension Discussed labs with patient today.  BP/HR at goal at office visit. She is on amlodipine 5 mg daily, HCTZ 25 mg daily. On 02/22/2021, CMP - stable.  4. Elevated LDL cholesterol level Discussed labs with patient today.  On 02/22/2021, lipid panel - all at goal - great job. She is not on statin therapy.  5. At risk for osteoporosis Laura Werner is at higher risk of osteopenia and osteoporosis due to Vitamin D deficiency and obesity.   Assessment/Plan:   1. Impaired fasting glucose, with polyphagia Take Mounjaro 5 mg on 03/12/2021/Sunday -  then at next injection increase to 7.5 mg- new Rx previously sent in.  2. Vitamin D insufficiency Start OTC multivitamin.  Start ergocalciferol 50,000 IU once weekly.  - Start Vitamin D, Ergocalciferol, (DRISDOL) 1.25 MG (50000 UNIT) CAPS capsule; Take 1 capsule (50,000 Units total) by mouth every 7 (seven) days.  Dispense: 4 capsule; Refill: 0  3. Essential hypertension Continue CCB/diuretic.  4. Elevated LDL cholesterol level Continue to decrease saturated fat.  5. At risk for osteoporosis Laura Werner was given approximately 15 minutes of osteoporosis prevention counseling today. Laura Werner is at risk for osteopenia and osteoporosis due to her Vitamin D deficiency. She was encouraged to take her Vitamin D and follow her higher calcium diet and increase strengthening exercise to help strengthen her bones and decrease her risk of osteopenia and osteoporosis.  Repetitive spaced learning was employed today to elicit superior memory formation and behavioral change.   6. Obesity, current BMI 44.8  Laura Werner is currently in the action stage of change. As such, her goal is to continue with weight loss efforts. She has agreed to following a lower carbohydrate, vegetable and lean protein rich diet plan.   Exercise goals: No exercise has been prescribed at this time.  Behavioral modification strategies: increasing lean protein intake, decreasing simple carbohydrates, meal planning and cooking strategies, keeping healthy foods in the home, and planning for success.  Laura Werner has agreed to follow-up with our clinic in 3-4 weeks. She was informed of the importance of frequent follow-up  visits to maximize her success with intensive lifestyle modifications for her multiple health conditions.   Objective:   Blood pressure 110/74, pulse 100, temperature 98 F (36.7 C), height 5\' 7"  (1.702 m), weight 286 lb (129.7 kg), SpO2 97 %. Body mass index is 44.79 kg/m.  General: Cooperative, alert, well developed, in no  acute distress. HEENT: Conjunctivae and lids unremarkable. Cardiovascular: Regular rhythm.  Lungs: Normal work of breathing. Neurologic: No focal deficits.   Lab Results  Component Value Date   CREATININE 0.77 02/22/2021   BUN 15 02/22/2021   NA 139 02/22/2021   K 3.9 02/22/2021   CL 99 02/22/2021   CO2 25 02/22/2021   Lab Results  Component Value Date   ALT 38 (H) 02/22/2021   AST 24 02/22/2021   ALKPHOS 119 02/22/2021   BILITOT 0.4 02/22/2021   Lab Results  Component Value Date   HGBA1C 5.6 08/24/2020   HGBA1C 5.1 08/19/2019   HGBA1C 5.2 10/14/2017   Lab Results  Component Value Date   INSULIN 18.8 02/22/2021   INSULIN 21.4 08/24/2020   Lab Results  Component Value Date   TSH 3.430 08/24/2020   Lab Results  Component Value Date   CHOL 155 02/22/2021   HDL 44 02/22/2021   LDLCALC 97 02/22/2021   TRIG 69 02/22/2021   CHOLHDL 3.5 02/22/2021   Lab Results  Component Value Date   VD25OH 26.4 (L) 02/22/2021   VD25OH 21.2 (L) 08/24/2020   Lab Results  Component Value Date   WBC 8.4 02/22/2021   HGB 14.2 02/22/2021   HCT 42.6 02/22/2021   MCV 80 02/22/2021   PLT 244 02/22/2021   Attestation Statements:   Reviewed by clinician on day of visit: allergies, medications, problem list, medical history, surgical history, family history, social history, and previous encounter notes.  I, Water quality scientist, CMA, am acting as Location manager for Mina Marble, NP.  I have reviewed the above documentation for accuracy and completeness, and I agree with the above. -  Katy d. Danford, NP-C

## 2021-03-15 ENCOUNTER — Other Ambulatory Visit: Payer: Self-pay | Admitting: Family Medicine

## 2021-03-15 DIAGNOSIS — F909 Attention-deficit hyperactivity disorder, unspecified type: Secondary | ICD-10-CM

## 2021-03-20 MED ORDER — AMPHETAMINE-DEXTROAMPHET ER 20 MG PO CP24: 20.0000 mg | ORAL_CAPSULE | Freq: Every day | ORAL | 0 refills | Status: DC

## 2021-03-24 ENCOUNTER — Other Ambulatory Visit: Payer: Self-pay | Admitting: Family Medicine

## 2021-03-24 DIAGNOSIS — I10 Essential (primary) hypertension: Secondary | ICD-10-CM

## 2021-04-03 ENCOUNTER — Encounter (INDEPENDENT_AMBULATORY_CARE_PROVIDER_SITE_OTHER): Payer: Self-pay

## 2021-04-03 ENCOUNTER — Encounter (INDEPENDENT_AMBULATORY_CARE_PROVIDER_SITE_OTHER): Payer: Self-pay | Admitting: Family Medicine

## 2021-04-03 ENCOUNTER — Telehealth (INDEPENDENT_AMBULATORY_CARE_PROVIDER_SITE_OTHER): Payer: Self-pay | Admitting: Family Medicine

## 2021-04-03 ENCOUNTER — Other Ambulatory Visit: Payer: Self-pay

## 2021-04-03 ENCOUNTER — Ambulatory Visit (INDEPENDENT_AMBULATORY_CARE_PROVIDER_SITE_OTHER): Payer: 59 | Admitting: Family Medicine

## 2021-04-03 VITALS — BP 115/78 | HR 67 | Temp 97.8°F | Ht 67.0 in | Wt 287.0 lb

## 2021-04-03 DIAGNOSIS — G4726 Circadian rhythm sleep disorder, shift work type: Secondary | ICD-10-CM

## 2021-04-03 DIAGNOSIS — Z6841 Body Mass Index (BMI) 40.0 and over, adult: Secondary | ICD-10-CM

## 2021-04-03 DIAGNOSIS — R7301 Impaired fasting glucose: Secondary | ICD-10-CM

## 2021-04-03 DIAGNOSIS — I10 Essential (primary) hypertension: Secondary | ICD-10-CM

## 2021-04-03 MED ORDER — LISINOPRIL-HYDROCHLOROTHIAZIDE 20-25 MG PO TABS: 1.0000 | ORAL_TABLET | Freq: Every day | ORAL | 3 refills | Status: DC

## 2021-04-03 MED ORDER — TIRZEPATIDE 7.5 MG/0.5ML ~~LOC~~ SOAJ: 7.5000 mg | SUBCUTANEOUS | 1 refills | Status: DC

## 2021-04-03 NOTE — Telephone Encounter (Signed)
Prior authorization denied for Mounjaro. Patient sent mychart message.  

## 2021-04-04 MED ORDER — ONDANSETRON 4 MG PO TBDP: 4.0000 mg | ORAL_TABLET | Freq: Four times a day (QID) | ORAL | 0 refills | Status: DC | PRN

## 2021-04-04 NOTE — Progress Notes (Signed)
Chief Complaint:   OBESITY Laura Werner is here to discuss her progress with her obesity treatment plan along with follow-up of her obesity related diagnoses. See Medical Weight Management Flowsheet for complete bioelectrical impedance results.  Today's visit was #: 9 Starting weight: 313 lbs Starting date: 08/24/2020 Weight change since last visit: +1 lb Total lbs lost to date: 26 lbs Total weight loss percentage to date: -8.31%  Nutrition Plan: Lower carbohydrate, vegetable and lean protein rich diet plan for 80-90% of the time. Activity: Walking for 30 minutes 3-4 times per week.  Anti-obesity medications: Mounjaro 7.5 mg subcutaneously weekly. Reported side effects: None.  Interim History: Laura Werner is up 5 pounds of fluid today.  She says that after increasing Mounjaro to the 7.5 mg dose, she had excess alcohol.  She had nausea, vomiting, diarrhea for several days.  Now improved.  Assessment/Plan:   1. Impaired fasting glucose, with polyphagia Improving, but not optimized. Current treatment: Mounjaro 7.5 mg subcutaneously weekly.    Plan: Continue Mounjaro 7.5 mg weekly.  Start Zofran 4 mg ODT every 6 hours as needed for nausea/vomiting, #20.  She will continue to focus on protein-rich, low simple carbohydrate foods. We reviewed the importance of hydration, regular exercise for stress reduction, and restorative sleep.  - Continue tirzepatide (MOUNJARO) 7.5 MG/0.5ML Pen; Inject 7.5 mg into the skin once a week.  Dispense: 2 mL; Refill: 1  2. Essential hypertension At goal. Medications: amlodipine 10 mg daily, HCTZ 25 mg daily.   Plan:  Stop amlodipine and HCTZ and start lisinopril-HCTZ 20-25 mg daily.  Check blood pressure at home.  Avoid buying foods that are: processed, frozen, or prepackaged to avoid excess salt. We will watch for signs of hypotension as she continues lifestyle modifications.  BP Readings from Last 3 Encounters:  04/03/21 115/78  03/08/21 110/74  02/22/21 114/76    Lab Results  Component Value Date   CREATININE 0.77 02/22/2021   - Start lisinopril-hydrochlorothiazide (ZESTORETIC) 20-25 MG tablet; Take 1 tablet by mouth daily.  Dispense: 90 tablet; Refill: 3  3. Circadian rhythm sleep disorder, shift work type Shift work refers to non-standard work schedules, including permanent or intermittent night work, early morning work, and Photographer. Shift work disorder is the development of sleep disturbances and impairment of waking alertness and performance.   4. Obesity, current BMI 45.0  Course: Laura Werner is currently in the action stage of change. As such, her goal is to continue with weight loss efforts.   Nutrition goals: She has agreed to following a lower carbohydrate, vegetable and lean protein rich diet plan.   Exercise goals:  As is.  Behavioral modification strategies: increasing lean protein intake, decreasing simple carbohydrates, increasing vegetables, and increasing water intake.  Laura Werner has agreed to follow-up with our clinic in 4 weeks. She was informed of the importance of frequent follow-up visits to maximize her success with intensive lifestyle modifications for her multiple health conditions.   Objective:   Blood pressure 115/78, pulse 67, temperature 97.8 F (36.6 C), temperature source Oral, height 5\' 7"  (1.702 m), weight 287 lb (130.2 kg), SpO2 99 %. Body mass index is 44.95 kg/m.  General: Cooperative, alert, well developed, in no acute distress. HEENT: Conjunctivae and lids unremarkable. Cardiovascular: Regular rhythm.  Lungs: Normal work of breathing. Neurologic: No focal deficits.   Lab Results  Component Value Date   CREATININE 0.77 02/22/2021   BUN 15 02/22/2021   NA 139 02/22/2021   K 3.9 02/22/2021  CL 99 02/22/2021   CO2 25 02/22/2021   Lab Results  Component Value Date   ALT 38 (H) 02/22/2021   AST 24 02/22/2021   ALKPHOS 119 02/22/2021   BILITOT 0.4 02/22/2021   Lab Results  Component Value  Date   HGBA1C 5.6 08/24/2020   HGBA1C 5.1 08/19/2019   HGBA1C 5.2 10/14/2017   Lab Results  Component Value Date   INSULIN 18.8 02/22/2021   INSULIN 21.4 08/24/2020   Lab Results  Component Value Date   TSH 3.430 08/24/2020   Lab Results  Component Value Date   CHOL 155 02/22/2021   HDL 44 02/22/2021   LDLCALC 97 02/22/2021   TRIG 69 02/22/2021   CHOLHDL 3.5 02/22/2021   Lab Results  Component Value Date   VD25OH 26.4 (L) 02/22/2021   VD25OH 21.2 (L) 08/24/2020   Lab Results  Component Value Date   WBC 8.4 02/22/2021   HGB 14.2 02/22/2021   HCT 42.6 02/22/2021   MCV 80 02/22/2021   PLT 244 02/22/2021   Attestation Statements:   Reviewed by clinician on day of visit: allergies, medications, problem list, medical history, surgical history, family history, social history, and previous encounter notes.  I, Water quality scientist, CMA, am acting as transcriptionist for Briscoe Deutscher, DO  I have reviewed the above documentation for accuracy and completeness, and I agree with the above. -  Briscoe Deutscher, DO, MS, FAAFP, DABOM - Family and Bariatric Medicine.

## 2021-05-03 ENCOUNTER — Other Ambulatory Visit: Payer: Self-pay

## 2021-05-03 ENCOUNTER — Encounter (INDEPENDENT_AMBULATORY_CARE_PROVIDER_SITE_OTHER): Payer: Self-pay | Admitting: Adult Health

## 2021-05-03 ENCOUNTER — Emergency Department (HOSPITAL_BASED_OUTPATIENT_CLINIC_OR_DEPARTMENT_OTHER)
Admission: EM | Admit: 2021-05-03 | Discharge: 2021-05-03 | Disposition: A | Discharge status: Home / Self Care | Payer: 59 | Attending: Emergency Medicine | Admitting: Emergency Medicine

## 2021-05-03 ENCOUNTER — Ambulatory Visit (INDEPENDENT_AMBULATORY_CARE_PROVIDER_SITE_OTHER): Payer: 59 | Admitting: Adult Health

## 2021-05-03 ENCOUNTER — Emergency Department (HOSPITAL_BASED_OUTPATIENT_CLINIC_OR_DEPARTMENT_OTHER): Payer: 59

## 2021-05-03 ENCOUNTER — Encounter (HOSPITAL_BASED_OUTPATIENT_CLINIC_OR_DEPARTMENT_OTHER): Payer: Self-pay | Admitting: Emergency Medicine

## 2021-05-03 VITALS — BP 110/74 | HR 108 | Temp 98.4°F | Ht 67.0 in | Wt 276.0 lb

## 2021-05-03 DIAGNOSIS — T383X5A Adverse effect of insulin and oral hypoglycemic [antidiabetic] drugs, initial encounter: Secondary | ICD-10-CM | POA: Diagnosis not present

## 2021-05-03 DIAGNOSIS — E559 Vitamin D deficiency, unspecified: Secondary | ICD-10-CM

## 2021-05-03 DIAGNOSIS — Z6841 Body Mass Index (BMI) 40.0 and over, adult: Secondary | ICD-10-CM

## 2021-05-03 DIAGNOSIS — R112 Nausea with vomiting, unspecified: Secondary | ICD-10-CM

## 2021-05-03 DIAGNOSIS — X58XXXA Exposure to other specified factors, initial encounter: Secondary | ICD-10-CM | POA: Insufficient documentation

## 2021-05-03 DIAGNOSIS — R748 Abnormal levels of other serum enzymes: Secondary | ICD-10-CM

## 2021-05-03 DIAGNOSIS — Z9189 Other specified personal risk factors, not elsewhere classified: Secondary | ICD-10-CM | POA: Diagnosis not present

## 2021-05-03 DIAGNOSIS — R7301 Impaired fasting glucose: Secondary | ICD-10-CM | POA: Diagnosis not present

## 2021-05-03 DIAGNOSIS — F909 Attention-deficit hyperactivity disorder, unspecified type: Secondary | ICD-10-CM

## 2021-05-03 DIAGNOSIS — T50905A Adverse effect of unspecified drugs, medicaments and biological substances, initial encounter: Secondary | ICD-10-CM

## 2021-05-03 DIAGNOSIS — Z20822 Contact with and (suspected) exposure to covid-19: Secondary | ICD-10-CM | POA: Insufficient documentation

## 2021-05-03 DIAGNOSIS — R197 Diarrhea, unspecified: Secondary | ICD-10-CM

## 2021-05-03 HISTORY — DX: Nausea with vomiting, unspecified: R11.2

## 2021-05-03 LAB — COMPREHENSIVE METABOLIC PANEL
ALT: 63 U/L — ABNORMAL HIGH (ref 0–44)
AST: 30 U/L (ref 15–41)
Albumin: 4.1 g/dL (ref 3.5–5.0)
Alkaline Phosphatase: 93 U/L (ref 38–126)
Anion gap: 10 (ref 5–15)
BUN: 14 mg/dL (ref 6–20)
CO2: 28 mmol/L (ref 22–32)
Calcium: 9 mg/dL (ref 8.9–10.3)
Chloride: 99 mmol/L (ref 98–111)
Creatinine, Ser: 0.82 mg/dL (ref 0.44–1.00)
GFR, Estimated: >60 mL/min (ref >60)
Glucose, Bld: 103 mg/dL — ABNORMAL HIGH (ref 70–99)
Potassium: 3.1 mmol/L — ABNORMAL LOW (ref 3.5–5.1)
Sodium: 137 mmol/L (ref 135–145)
Total Bilirubin: 0.6 mg/dL (ref 0.3–1.2)
Total Protein: 6.8 g/dL (ref 6.5–8.1)

## 2021-05-03 LAB — URINALYSIS, ROUTINE W REFLEX MICROSCOPIC
Glucose, UA: NEGATIVE mg/dL
Hgb urine dipstick: NEGATIVE
Ketones, ur: 15 mg/dL — AB
Leukocytes,Ua: NEGATIVE
Nitrite: NEGATIVE
Protein, ur: 30 mg/dL — AB
Specific Gravity, Urine: 1.031 — ABNORMAL HIGH (ref 1.005–1.030)
pH: 6 (ref 5.0–8.0)

## 2021-05-03 LAB — RESP PANEL BY RT-PCR (FLU A&B, COVID) ARPGX2
Influenza A by PCR: NEGATIVE
Influenza B by PCR: NEGATIVE
SARS Coronavirus 2 by RT PCR: NEGATIVE

## 2021-05-03 LAB — CBC
HCT: 43 % (ref 36.0–46.0)
Hemoglobin: 14.6 g/dL (ref 12.0–15.0)
MCH: 26.6 pg (ref 26.0–34.0)
MCHC: 34 g/dL (ref 30.0–36.0)
MCV: 78.5 fL — ABNORMAL LOW (ref 80.0–100.0)
Platelets: 306 10*3/uL (ref 150–400)
RBC: 5.48 MIL/uL — ABNORMAL HIGH (ref 3.87–5.11)
RDW: 13.2 % (ref 11.5–15.5)
WBC: 10.9 10*3/uL — ABNORMAL HIGH (ref 4.0–10.5)
nRBC: 0 % (ref 0.0–0.2)

## 2021-05-03 LAB — LIPASE, BLOOD: Lipase: 661 U/L — ABNORMAL HIGH (ref 11–51)

## 2021-05-03 LAB — PREGNANCY, URINE: Preg Test, Ur: NEGATIVE

## 2021-05-03 MED ORDER — IOHEXOL 300 MG/ML  SOLN
100.0000 mL | Freq: Once | INTRAMUSCULAR | Status: AC | PRN
  Administered 2021-05-03: 100 mL via INTRAVENOUS

## 2021-05-03 MED ORDER — HYDROCODONE-ACETAMINOPHEN 5-325 MG PO TABS
2.0000 | ORAL_TABLET | ORAL | 0 refills | Status: DC | PRN
Start: 2021-05-03 — End: 2021-05-22

## 2021-05-03 MED ORDER — ONDANSETRON HCL 4 MG/2ML IJ SOLN
4.0000 mg | Freq: Once | INTRAMUSCULAR | Status: AC
  Administered 2021-05-03: 4 mg via INTRAVENOUS
  Filled 2021-05-03: qty 2

## 2021-05-03 MED ORDER — TIRZEPATIDE 5 MG/0.5ML ~~LOC~~ SOAJ: 5.0000 mg | SUBCUTANEOUS | 0 refills | Status: DC

## 2021-05-03 MED ORDER — LACTATED RINGERS IV BOLUS
1000.0000 mL | Freq: Once | INTRAVENOUS | Status: AC
  Administered 2021-05-03: 1000 mL via INTRAVENOUS

## 2021-05-03 MED ORDER — ONDANSETRON 4 MG PO TBDP: 4.0000 mg | ORAL_TABLET | Freq: Three times a day (TID) | ORAL | 0 refills | Status: DC | PRN

## 2021-05-03 NOTE — Discharge Instructions (Addendum)
Stop taking your Mounjaro.  Recommend you follow-up with your weight loss providers regarding this medication as elevated lipase is a side effect of the medication.  Recommend you advance a clear liquid diet as tolerated slowly over the next few days.  Return for inability to tolerate oral intake as it could warrant admission to the hospital given your symptoms.

## 2021-05-03 NOTE — ED Triage Notes (Signed)
Emesis and diarrhea  x 6 days . Oliguria yet no dysuria. Denies abd pain .

## 2021-05-03 NOTE — Progress Notes (Signed)
Chief Complaint:   OBESITY Laura Werner is here to discuss her progress with her obesity treatment plan along with follow-up of her obesity related diagnoses. Laura Werner is on the Bariatric/lower carbohydrate, vegetable and lean protein rich diet plan and states she is following her eating plan approximately 50% of the time. Laura Werner states she is not exercising due to illness.  Today's visit was #: 10 Starting weight: 313 lbs Starting date: 08/24/2020 Today's weight: 276 lbs Today's date: 05/03/2021 Total lbs lost to date: 37 lbs Total lbs lost since last in-office visit: 11 lbs  Interim History:  On 10/04/2020, Laura Werner was prescribed Ozempic 0.25 mg - never started. On 11/23/2020, prescribed Mounjaro 2.5 mg - slowly increased to tolerated 5 mg. Since increasing to 7.5 mg, has experienced N/V/D.   Last injection - 04/27/2021 - since then has experienced persistent N/V after eating with profuse loose stools. She denies hematochezia or hematuria. She denies abdominal pain.   Subjective:   1. Impaired fasting glucose, with polyphagia Upon Epic review, BG 80-110s with elevated insulin.  On 10/04/2020, Laura Werner was prescribed Ozempic 0.25 mg - never started. On 11/23/2020, prescribed Mounjaro 2.5 mg - slowly increased to tolerated 5 mg. Since increasing to 7.5 mg, has experienced N/V/D.   Last injection - 04/27/2021 - since then has experienced persistent N/V after eating with profuse loose stools.  BP 110/74, HR 108- likely due to dehydration from recent GI upset. She denies hematochezia or hematuria. She denies abdominal pain.  2. Vitamin D insufficiency Vitamin D level on 02/22/2021 - 26.4 = well below goal of 50. She is currently taking prescription ergocalciferol 50,000 IU each week. She denies nausea, vomiting or muscle weakness.  3. Nausea and vomiting, unspecified vomiting type Remain off Mounjaro-proceed to nearest UC/ED for evaluation/treatment.    4. At risk for deficient intake of  food Laura Werner is at risk for deficient intake of food due to N/V/D.  Assessment/Plan:   1. Impaired fasting glucose, with polyphagia Remain off Mounjaro-proceed to nearest UC/ED for evaluation/treatment.  Pt verbalized understanding/agreement.  2. Vitamin D insufficiency Continue ergocalciferol.  No need for refill.  3. Nausea and vomiting, unspecified vomiting type Proceed to nearest ED/UC - pt verbalized understanding/agreement.  4. At risk for deficient intake of food Anab was given approximately 15 minutes of deficit intake of food prevention counseling today. Laura Werner is at risk for eating too few calories based on current food recall. She was encouraged to focus on meeting caloric and protein goals according to her recommended meal plan.    5. Obesity, current BMI 43.3  Laura Werner is currently in the action stage of change. As such, her goal is to continue with weight loss efforts. She has agreed to the Bariatric/lower carbohydrate, vegetable and lean protein rich diet plan.   Go to ED/UC for evaluation/treatment.  Exercise goals: No exercise has been prescribed at this time.  Behavioral modification strategies: increasing lean protein intake, decreasing simple carbohydrates, increasing water intake, meal planning and cooking strategies, keeping healthy foods in the home, and planning for success.  Laura Werner has agreed to follow-up with our clinic in 2-3 weeks. She was informed of the importance of frequent follow-up visits to maximize her success with intensive lifestyle modifications for her multiple health conditions.   Objective:   Blood pressure 110/74, pulse (!) 108, temperature 98.4 F (36.9 C), height 5\' 7"  (1.702 m), weight 276 lb (125.2 kg), SpO2 98 %. Body mass index is 43.23 kg/m.  General: Cooperative, alert, well  developed, in no acute distress. HEENT: Conjunctivae and lids unremarkable. Cardiovascular: Regular rhythm.  Lungs: Normal work of breathing. Neurologic: No  focal deficits.   Lab Results  Component Value Date   CREATININE 0.77 02/22/2021   BUN 15 02/22/2021   NA 139 02/22/2021   K 3.9 02/22/2021   CL 99 02/22/2021   CO2 25 02/22/2021   Lab Results  Component Value Date   ALT 38 (H) 02/22/2021   AST 24 02/22/2021   ALKPHOS 119 02/22/2021   BILITOT 0.4 02/22/2021   Lab Results  Component Value Date   HGBA1C 5.6 08/24/2020   HGBA1C 5.1 08/19/2019   HGBA1C 5.2 10/14/2017   Lab Results  Component Value Date   INSULIN 18.8 02/22/2021   INSULIN 21.4 08/24/2020   Lab Results  Component Value Date   TSH 3.430 08/24/2020   Lab Results  Component Value Date   CHOL 155 02/22/2021   HDL 44 02/22/2021   LDLCALC 97 02/22/2021   TRIG 69 02/22/2021   CHOLHDL 3.5 02/22/2021   Lab Results  Component Value Date   VD25OH 26.4 (L) 02/22/2021   VD25OH 21.2 (L) 08/24/2020   Lab Results  Component Value Date   WBC 8.4 02/22/2021   HGB 14.2 02/22/2021   HCT 42.6 02/22/2021   MCV 80 02/22/2021   PLT 244 02/22/2021   Attestation Statements:   Reviewed by clinician on day of visit: allergies, medications, problem list, medical history, surgical history, family history, social history, and previous encounter notes.  I, Water quality scientist, CMA, am acting as Location manager for Mina Marble, NP.  I have reviewed the above documentation for accuracy and completeness, and I agree with the above. -  Lynann Demetrius d. Azalea Cedar, NP-C

## 2021-05-03 NOTE — ED Provider Notes (Signed)
[facility] EMERGENCY DEPT Provider Note   CSN: [id number] Arrival date & time: 05/03/21  7341     History  Chief Complaint  Patient presents with   Emesis   Diarrhea    Laura Werner is a 36 y.o. female.   Emesis Associated symptoms: diarrhea   Diarrhea Associated symptoms: vomiting    36 year old female with a history of obesity on Mounjaro for weight loss who presents to the emergency department with nausea, vomiting, diarrhea for the past few days.  The patient states that she has had some difficulty tolerating oral intake.  She denies any abdominal pain at this time.  She denies any fevers or chills.  While in the emergency department, she has been tolerating oral water.  She denies any dysuria.  Home Medications Prior to Admission medications   Medication Sig Start Date End Date Taking? Authorizing Provider  HYDROcodone-acetaminophen (NORCO/VICODIN) 5-325 MG tablet Take 2 tablets by mouth every 4 (four) hours as needed. 05/03/21  Yes Regan Lemming, MD  ondansetron (ZOFRAN-ODT) 4 MG disintegrating tablet Take 1 tablet (4 mg total) by mouth every 8 (eight) hours as needed for nausea or vomiting. 05/03/21  Yes Regan Lemming, MD  amphetamine-dextroamphetamine (ADDERALL XR) 20 MG 24 hr capsule Take 1 capsule (20 mg total) by mouth daily. 03/20/21   Leamon Arnt, MD  lisinopril-hydrochlorothiazide (ZESTORETIC) 20-25 MG tablet Take 1 tablet by mouth daily. 04/03/21   Briscoe Deutscher, DO  Vitamin D, Ergocalciferol, (DRISDOL) 1.25 MG (50000 UNIT) CAPS capsule Take 1 capsule (50,000 Units total) by mouth every 7 (seven) days. 03/08/21   Esaw Grandchild, NP      Allergies    Patient has no known allergies.    Review of Systems   Review of Systems  Gastrointestinal:  Positive for diarrhea and vomiting.   Physical Exam Updated Vital Signs BP 104/69 (BP Location: Left Arm)    Pulse 89    Temp 98.2 F (36.8 C) (Oral)    Resp 17    Ht 5\' 7"  (1.702 m)    Wt 126.1  kg    LMP 04/26/2021 (Exact Date)    SpO2 99%    BMI 43.54 kg/m  Physical Exam Vitals and nursing note reviewed.  Constitutional:      General: She is not in acute distress.    Appearance: She is well-developed.  HENT:     Head: Normocephalic and atraumatic.  Eyes:     Conjunctiva/sclera: Conjunctivae normal.     Pupils: Pupils are equal, round, and reactive to light.  Cardiovascular:     Rate and Rhythm: Normal rate and regular rhythm.     Heart sounds: No murmur heard. Pulmonary:     Effort: Pulmonary effort is normal. No respiratory distress.     Breath sounds: Normal breath sounds.  Abdominal:     General: There is no distension.     Palpations: Abdomen is soft.     Tenderness: There is no abdominal tenderness. There is no guarding.  Musculoskeletal:        General: No swelling, deformity or signs of injury.     Cervical back: Neck supple.  Skin:    General: Skin is warm and dry.     Capillary Refill: Capillary refill takes less than 2 seconds.     Findings: No lesion or rash.  Neurological:     General: No focal deficit present.     Mental Status: She is alert. Mental status is at baseline.  Psychiatric:        Mood and Affect: Mood normal.    ED Results / Procedures / Treatments   Labs (all labs ordered are listed, but only abnormal results are displayed) Labs Reviewed  LIPASE, BLOOD - Abnormal; Notable for the following components:      Result Value   Lipase 661 (*)    All other components within normal limits  COMPREHENSIVE METABOLIC PANEL - Abnormal; Notable for the following components:   Potassium 3.1 (*)    Glucose, Bld 103 (*)    ALT 63 (*)    All other components within normal limits  CBC - Abnormal; Notable for the following components:   WBC 10.9 (*)    RBC 5.48 (*)    MCV 78.5 (*)    All other components within normal limits  URINALYSIS, ROUTINE W REFLEX MICROSCOPIC - Abnormal; Notable for the following components:   Specific Gravity, Urine  1.031 (*)    Bilirubin Urine SMALL (*)    Ketones, ur 15 (*)    Protein, ur 30 (*)    Bacteria, UA MANY (*)    All other components within normal limits  RESP PANEL BY RT-PCR (FLU A&B, COVID) ARPGX2  PREGNANCY, URINE    EKG None  Radiology CT ABDOMEN PELVIS W CONTRAST  Result Date: 05/03/2021 CLINICAL DATA:  Emesis and diarrhea for 6 days EXAM: CT ABDOMEN AND PELVIS WITH CONTRAST TECHNIQUE: Multidetector CT imaging of the abdomen and pelvis was performed using the standard protocol following bolus administration of intravenous contrast. CONTRAST:  129mL OMNIPAQUE IOHEXOL 300 MG/ML  SOLN COMPARISON:  None. FINDINGS: Lower chest: The lung bases are clear. The imaged heart is unremarkable. Hepatobiliary: The liver and gallbladder are unremarkable. There is no biliary ductal dilatation. Pancreas: The pancreas is unremarkable, with no focal lesion or contour abnormality. There is no main pancreatic ductal dilatation or peripancreatic inflammatory change. Spleen: Unremarkable. Adrenals/Urinary Tract: The adrenals are unremarkable. The kidneys are unremarkable, with no focal lesion, stone, hydronephrosis, or hydroureter. The bladder is decompressed but grossly unremarkable. Stomach/Bowel: The stomach is unremarkable. There is no evidence of bowel obstruction. There is no abnormal bowel wall thickening or inflammatory change. There are a few colonic diverticula without evidence of acute diverticulitis. The appendix is normal. Vascular/Lymphatic: The abdominal aorta is normal in course and caliber. The major branch vessels are patent. The main portal and splenic veins are patent. There is no abdominal or pelvic lymphadenopathy. Reproductive: The uterus and adnexa are unremarkable. Other: There is trace free fluid in the pelvis, within physiologic limits. There is no upper abdominal ascites. There is no free air. Musculoskeletal: There is no acute osseous abnormality or aggressive osseous lesion. IMPRESSION: No  acute findings in the abdomen or pelvis. No CT findings of acute pancreatitis. Electronically Signed   By: Valetta Mole M.D.   On: 05/03/2021 12:07    Procedures Procedures    Medications Ordered in ED Medications  ondansetron (ZOFRAN) injection 4 mg (4 mg Intravenous Given 05/03/21 1137)  lactated ringers bolus 1,000 mL (0 mLs Intravenous Stopped 05/03/21 1304)  iohexol (OMNIPAQUE) 300 MG/ML solution 100 mL (100 mLs Intravenous Contrast Given 05/03/21 1144)    ED Course/ Medical Decision Making/ A&P Clinical Course as of 05/04/21 1522  Wed May 03, 2021  1114 Lipase(!): 661 [JL]    Clinical Course User Index [JL] Regan Lemming, MD  Medical Decision Making  36 year old female with a history of obesity on Mounjaro for weight loss who presents to the emergency department with nausea, vomiting, diarrhea for the past few days.  The patient states that she has had some difficulty tolerating oral intake.  She denies any abdominal pain at this time.  She denies any fevers or chills.  While in the emergency department, she has been tolerating oral water.  She denies any dysuria.  On arrival, the patient was afebrile, mildly tachycardic P1 13, subsequently resolved, normotensive, saturating well on room air. Normal sinus rhythm noted on cardiac telemetry on my evaluation.  Patient physical exam significant for a completely benign abdominal exam with no abdominal tenderness palpation in the epigastrium or right upper quadrant.  The patient was administered an IV fluid bolus, IV Zofran with subsequent resolution in her initial tachycardia.  She appears overall well-hydrated and is tolerating oral intake in the ED.  Laboratory work-up significant for negative COVID-19 and influenza PCR, urinalysis with many bacteria, negative nitrites, leukocytes, 0-5 WBCs.  The patient denies any dysuria.  We will not treat asymptomatic bacteriuria.  CMP with mild hypokalemia to 3.1, mild elevation  in ALT which is chronic for the patient, and no other acute abnormalities noted.  CBC with a mild leukocytosis to 10.9, otherwise unremarkable.  Urine pregnancy negative.  The patient notably was found to have an elevated lipase of 661.  She denies any history of significant alcohol intake or gallstones.  She is taking Mounjaro which on further review, elevated lipase enzymes appears to be a potential side effect of the medication.  The patient has been in discussion with her prescribing providers who have recommended that she stop taking the medication.  I agree with this recommendation given her elevated lipase.  She has symptoms concerning for gastroenteritis versus pancreatitis.  Will obtain CT abdomen pelvis to further evaluate.  CT abdomen pelvis revealed no acute findings in the abdomen or pelvis, no evidence of peripancreatic or pancreatic inflammation with no lesion or contour abnormality.  There was no evidence of main pancreatic ductal dilatation or inflammatory changes noted.  The gallbladder was unremarkable with no biliary ductal dilatation and no evidence of gallstones.  The patient is overall well-appearing, without pain and tolerating oral intake.  Her elevated lipase enzymes alone do not meet criteria for pancreatitis.  Favor likely side effect of the patient's medication which she has advised to stop.  I recommended that the patient slowly advance a liquid clear diet as tolerated as would be the case for treatment for pancreatitis, follow-up urgently with her PCP for recheck of her pancreatic enzymes outpatient.  At this time, the patient appears stable for continued outpatient management.  Zofran and Norco provided in the event that she has significant discomfort and nausea.  Return precautions provided.     Final Clinical Impression(s) / ED Diagnoses Final diagnoses:  Elevated lipase  Adverse effect of drug, initial encounter  Diarrhea, unspecified type  Nausea and vomiting,  unspecified vomiting type    Rx / DC Orders ED Discharge Orders          Ordered    ondansetron (ZOFRAN-ODT) 4 MG disintegrating tablet  Every 8 hours PRN        05/03/21 1259    HYDROcodone-acetaminophen (NORCO/VICODIN) 5-325 MG tablet  Every 4 hours PRN        05/03/21 1259              Regan Lemming, MD 05/04/21  1522 ° °

## 2021-05-03 NOTE — ED Notes (Signed)
Patient taken to CT.

## 2021-05-03 NOTE — Telephone Encounter (Signed)
FYI

## 2021-05-11 ENCOUNTER — Other Ambulatory Visit: Payer: Self-pay | Admitting: Family Medicine

## 2021-05-11 DIAGNOSIS — F909 Attention-deficit hyperactivity disorder, unspecified type: Secondary | ICD-10-CM

## 2021-05-11 NOTE — Telephone Encounter (Signed)
LVM for patient to return call, patient needs an appointment before refilling medication. Has been over 6 months.

## 2021-05-11 NOTE — Telephone Encounter (Signed)
Patient called requesting Dr. Jonni Sanger to use her [facility] visits for her Adderall refills.  I advised that our office can not do that.   Patient states she will schedule appt through mychart.

## 2021-05-12 ENCOUNTER — Encounter: Payer: Self-pay | Admitting: Family Medicine

## 2021-05-12 ENCOUNTER — Other Ambulatory Visit: Payer: Self-pay | Admitting: Physician Assistant

## 2021-05-12 ENCOUNTER — Other Ambulatory Visit: Payer: Self-pay

## 2021-05-12 DIAGNOSIS — F909 Attention-deficit hyperactivity disorder, unspecified type: Secondary | ICD-10-CM

## 2021-05-12 MED ORDER — AMPHETAMINE-DEXTROAMPHET ER 20 MG PO CP24: 20.0000 mg | ORAL_CAPSULE | Freq: Every day | ORAL | 0 refills | Status: DC

## 2021-05-12 NOTE — Progress Notes (Unsigned)
Last Refill 03/20/2021 Last OV 09/11/2020 dx annual physical  Patient has an ADHD follow up scheduled.

## 2021-05-15 ENCOUNTER — Encounter (INDEPENDENT_AMBULATORY_CARE_PROVIDER_SITE_OTHER): Payer: Self-pay | Admitting: Family Medicine

## 2021-05-22 ENCOUNTER — Telehealth (INDEPENDENT_AMBULATORY_CARE_PROVIDER_SITE_OTHER): Payer: 59 | Admitting: Family Medicine

## 2021-05-22 ENCOUNTER — Encounter: Payer: Self-pay | Admitting: Family Medicine

## 2021-05-22 ENCOUNTER — Other Ambulatory Visit: Payer: Self-pay

## 2021-05-22 DIAGNOSIS — Z6841 Body Mass Index (BMI) 40.0 and over, adult: Secondary | ICD-10-CM | POA: Diagnosis not present

## 2021-05-22 DIAGNOSIS — I10 Essential (primary) hypertension: Secondary | ICD-10-CM | POA: Diagnosis not present

## 2021-05-22 DIAGNOSIS — F909 Attention-deficit hyperactivity disorder, unspecified type: Secondary | ICD-10-CM

## 2021-05-22 MED ORDER — AMPHETAMINE-DEXTROAMPHET ER 20 MG PO CP24: 20.0000 mg | ORAL_CAPSULE | Freq: Every day | ORAL | 0 refills | Status: DC

## 2021-05-22 NOTE — Progress Notes (Signed)
Virtual Visit via Video Note  Subjective  CC:  Chief Complaint  Patient presents with   ADHD    Medication refill.     I connected with [name] on 05/22/21 at  4:15 PM EST by a video enabled telemedicine application and verified that I am speaking with the correct person using two identifiers. Location patient: Home Location provider: [facility] [place], Office Persons participating in the virtual visit: [name], Leamon Arnt, MD Tyna Jaksch CMA  I discussed the limitations of evaluation and management by telemedicine and the availability of in person appointments. The patient expressed understanding and agreed to proceed. HPI: Laura Werner is a 36 y.o. female who was contacted today to address the problems listed above in the chief complaint. ADD: no concerns with meds. Stable on adderall xr 20 daily for years. Needs refills. Denies adverse effects. I reviewed patient's records from the PMP aware controlled substance registry today.  Seeing healthy weight and wellness. Reviewed labs and office visit. Down > 30 pounds. Due for female wellness. Had acute pancreatitis on mounjaro. Reviewed ed notes. HTN: Feeling well. Taking medications w/o adverse effects. No symptoms of CHF, angina; no palpitations, sob, cp or lower extremity edema. Compliant with meds. Lisinopril hctz daily.  Assessment  1. Attention deficit hyperactivity disorder (ADHD), unspecified ADHD type      Plan  ADD and HTN are both well controlled. :  refilled adderall xr 20 daily fo r6 months. Continue ace/hctz for blood pressure Continue diet and exercise.  Rec cpe in may or June.  Needs to set up pap with gyn.  I discussed the assessment and treatment plan with the patient. The patient was provided an opportunity to ask questions and all were answered. The patient agreed with the plan and demonstrated an understanding of the instructions.   The  patient was advised to call back or seek an in-person evaluation if the symptoms worsen or if the condition fails to improve as anticipated. Follow up: 6 mo for add and cpe  Visit date not found  No orders of the defined types were placed in this encounter.     I reviewed the patients updated PMH, FH, and SocHx.    Patient Active Problem List   Diagnosis Date Noted   Nausea and vomiting 05/03/2021   Elevated LDL cholesterol level 03/08/2021   Impaired fasting glucose 01/26/2021   Vitamin D insufficiency 08/29/2020   Elevated glucose 08/24/2020   Attention deficit hyperactivity disorder (ADHD) 10/15/2017   Essential hypertension 10/15/2017   Class 3 severe obesity with serious comorbidity and body mass index (BMI) of 45.0 to 49.9 in adult [facility]) 02/12/2013   Current Meds  Medication Sig   amphetamine-dextroamphetamine (ADDERALL XR) 20 MG 24 hr capsule Take 1 capsule (20 mg total) by mouth daily.   lisinopril-hydrochlorothiazide (ZESTORETIC) 20-25 MG tablet Take 1 tablet by mouth daily.   ondansetron (ZOFRAN-ODT) 4 MG disintegrating tablet Take 1 tablet (4 mg total) by mouth every 8 (eight) hours as needed for nausea or vomiting.   Vitamin D, Ergocalciferol, (DRISDOL) 1.25 MG (50000 UNIT) CAPS capsule Take 1 capsule (50,000 Units total) by mouth every 7 (seven) days.    Allergies: Patient has No Known Allergies. Family History: Patient family history includes Alcohol abuse in her father; Anxiety disorder in her mother; Breast cancer (age of onset: 73) in her mother; Cancer in her father; Depression in her father and mother; Diabetes in  her mother; Healthy in her brother; Hypertension in her mother and another family member; Lung cancer in her paternal grandfather and paternal grandmother; Melanoma in her maternal grandmother; Obesity in her father and mother. Social History:  Patient  reports that she has never smoked. She has never used smokeless tobacco. She reports current alcohol  use. She reports that she does not use drugs.  Review of Systems: Constitutional: Negative for fever malaise or anorexia Cardiovascular: negative for chest pain Respiratory: negative for SOB or persistent cough Gastrointestinal: negative for abdominal pain  OBJECTIVE Vitals: LMP 04/26/2021 (Exact Date)  General: no acute distress , A&Ox3  Leamon Arnt, MD

## 2021-06-05 ENCOUNTER — Ambulatory Visit (INDEPENDENT_AMBULATORY_CARE_PROVIDER_SITE_OTHER): Payer: 59 | Admitting: Family Medicine

## 2021-06-06 ENCOUNTER — Encounter (INDEPENDENT_AMBULATORY_CARE_PROVIDER_SITE_OTHER): Payer: Self-pay

## 2021-06-06 ENCOUNTER — Other Ambulatory Visit: Payer: Self-pay

## 2021-06-06 ENCOUNTER — Ambulatory Visit (INDEPENDENT_AMBULATORY_CARE_PROVIDER_SITE_OTHER): Payer: 59 | Admitting: Adult Health

## 2021-06-06 ENCOUNTER — Encounter (INDEPENDENT_AMBULATORY_CARE_PROVIDER_SITE_OTHER): Payer: Self-pay | Admitting: Adult Health

## 2021-06-06 VITALS — BP 124/77 | HR 78 | Temp 98.1°F | Ht 67.0 in | Wt 285.0 lb

## 2021-06-06 DIAGNOSIS — F909 Attention-deficit hyperactivity disorder, unspecified type: Secondary | ICD-10-CM

## 2021-06-06 DIAGNOSIS — Z6841 Body Mass Index (BMI) 40.0 and over, adult: Secondary | ICD-10-CM

## 2021-06-06 DIAGNOSIS — R7301 Impaired fasting glucose: Secondary | ICD-10-CM | POA: Diagnosis not present

## 2021-06-06 DIAGNOSIS — E669 Obesity, unspecified: Secondary | ICD-10-CM | POA: Diagnosis not present

## 2021-06-07 NOTE — Progress Notes (Signed)
Chief Complaint:   OBESITY Laura Werner is here to discuss her progress with her obesity treatment plan along with follow-up of her obesity related diagnoses. Laura Werner is on the Bariatric/Lower carbohydrate, vegetable and lean protein rich diet plan and states she is following her eating plan approximately 60% of the time. Laura Werner states she is walking for 30 minutes 5 times per week.  Today's visit was #: 11 Starting weight: 313 lbs Starting date: 08/24/2020 Today's weight: 285 lbs Today's date: 06/06/2021 Total lbs lost to date: 28 lbs Total lbs lost since last in-office visit: 0  Interim History:  On 05/22/2021, Marvalene had a video MyChart visit with her PCP/Dr. Jonni Sanger -  ADHD - continued on Adderall XR 20 mg daily - 6 months of refills provided.    Last dose of Mounjaro 7.5 mg was on 04/27/2021. She developed N/V/D OV at [facility] 05/03/21- instructed to seek immediate care at local ED On 05/03/2021 - reviewed notes/labs/imaging from ED visit. Since off Mounjaro- she has experienced increase in polyphagia.  Of note- unable to tolerate Metformin - GI upset - severe loose stools.  Subjective:   1. Impaired fasting glucose, with polyphagia Last dose of Mounjaro 7.5 mg was on 04/27/2021. She developed N/V/D OV at [facility] 05/03/21- instructed to seek immediate care at local ED On 05/03/2021 - reviewed notes/labs/imaging from ED visit. Since off Mounjaro- she has experienced increase in polyphagia.  Of note- unable to tolerate Metformin - GI upset - severe loose stools.  She denies any current GI upset or abd pain at present.  2. Attention deficit hyperactivity disorder (ADHD), unspecified ADHD type PDMP reviewed - Adderall XR 20 mg last refilled on 03/20/2021. She has been off stimulant for weeks and reports poor concentration and difficulty remaining awake during night shift.   PCP recently refilled Adderall XR - prescription on backorder.  Assessment/Plan:   1. Impaired fasting glucose, with  polyphagia Remain off Mounjaro. Increase protein and regular exercise.  2. Attention deficit hyperactivity disorder (ADHD), unspecified ADHD type Increase regular exercise. Adderall XR 20 mg per PCP.  3. Obesity, current BMI 44.8  Laura Werner is currently in the action stage of change. As such, her goal is to continue with weight loss efforts. She has agreed to following a lower carbohydrate, vegetable and lean protein rich diet plan.   Exercise goals:  As is.  Behavioral modification strategies: increasing lean protein intake, decreasing simple carbohydrates, meal planning and cooking strategies, keeping healthy foods in the home, and planning for success.  Genisis has agreed to follow-up with our clinic in 2-3 weeks. She was informed of the importance of frequent follow-up visits to maximize her success with intensive lifestyle modifications for her multiple health conditions.   Objective:   Blood pressure 124/77, pulse 78, temperature 98.1 F (36.7 C), height 5\' 7"  (1.702 m), weight 285 lb (129.3 kg), SpO2 99 %. Body mass index is 44.64 kg/m.  General: Cooperative, alert, well developed, in no acute distress. HEENT: Conjunctivae and lids unremarkable. Cardiovascular: Regular rhythm.  Lungs: Normal work of breathing. Neurologic: No focal deficits.   Lab Results  Component Value Date   CREATININE 0.82 05/03/2021   BUN 14 05/03/2021   NA 137 05/03/2021   K 3.1 (L) 05/03/2021   CL 99 05/03/2021   CO2 28 05/03/2021   Lab Results  Component Value Date   ALT 63 (H) 05/03/2021   AST 30 05/03/2021   ALKPHOS 93 05/03/2021   BILITOT 0.6 05/03/2021   Lab  Results  Component Value Date   HGBA1C 5.6 08/24/2020   HGBA1C 5.1 08/19/2019   HGBA1C 5.2 10/14/2017   Lab Results  Component Value Date   INSULIN 18.8 02/22/2021   INSULIN 21.4 08/24/2020   Lab Results  Component Value Date   TSH 3.430 08/24/2020   Lab Results  Component Value Date   CHOL 155 02/22/2021   HDL 44  02/22/2021   LDLCALC 97 02/22/2021   TRIG 69 02/22/2021   CHOLHDL 3.5 02/22/2021   Lab Results  Component Value Date   VD25OH 26.4 (L) 02/22/2021   VD25OH 21.2 (L) 08/24/2020   Lab Results  Component Value Date   WBC 10.9 (H) 05/03/2021   HGB 14.6 05/03/2021   HCT 43.0 05/03/2021   MCV 78.5 (L) 05/03/2021   PLT 306 05/03/2021   Attestation Statements:   Reviewed by clinician on day of visit: allergies, medications, problem list, medical history, surgical history, family history, social history, and previous encounter notes.  Time spent on visit including pre-visit chart review and post-visit care and charting was 27 minutes.   I, Water quality scientist, CMA, am acting as Location manager for Mina Marble, NP.  I have reviewed the above documentation for accuracy and completeness, and I agree with the above. -  Kady Toothaker d. Rozella Servello, NP-C

## 2021-06-20 ENCOUNTER — Other Ambulatory Visit: Payer: Self-pay

## 2021-06-20 ENCOUNTER — Ambulatory Visit (INDEPENDENT_AMBULATORY_CARE_PROVIDER_SITE_OTHER): Payer: 59 | Admitting: Adult Health

## 2021-06-20 ENCOUNTER — Encounter (INDEPENDENT_AMBULATORY_CARE_PROVIDER_SITE_OTHER): Payer: Self-pay | Admitting: Adult Health

## 2021-06-20 VITALS — BP 103/69 | HR 73 | Temp 98.0°F | Ht 67.0 in | Wt 286.0 lb

## 2021-06-20 DIAGNOSIS — Z9189 Other specified personal risk factors, not elsewhere classified: Secondary | ICD-10-CM

## 2021-06-20 DIAGNOSIS — E559 Vitamin D deficiency, unspecified: Secondary | ICD-10-CM

## 2021-06-20 DIAGNOSIS — Z6841 Body Mass Index (BMI) 40.0 and over, adult: Secondary | ICD-10-CM | POA: Diagnosis not present

## 2021-06-20 DIAGNOSIS — E66813 Obesity, class 3: Secondary | ICD-10-CM

## 2021-06-20 DIAGNOSIS — R7301 Impaired fasting glucose: Secondary | ICD-10-CM | POA: Diagnosis not present

## 2021-06-20 DIAGNOSIS — R7989 Other specified abnormal findings of blood chemistry: Secondary | ICD-10-CM

## 2021-06-20 DIAGNOSIS — E669 Obesity, unspecified: Secondary | ICD-10-CM

## 2021-06-20 NOTE — Progress Notes (Signed)
Chief Complaint:   OBESITY Laura Werner is here to discuss her progress with her obesity treatment plan along with follow-up of her obesity related diagnoses. Laura Werner is on following a lower carbohydrate, vegetable and lean protein rich diet plan and states she is following her eating plan approximately 75% of the time. Laura Werner states she is walking for 30 minutes 3-4 times per week.  Today's visit was #: 12 Starting weight: 313 lbs Starting date: 08/24/2020 Today's weight: 286 lbs Today's date: 06/20/2021 Total lbs lost to date: 27 lbs Total lbs lost since last in-office visit: 0  Interim History:  Laura Werner has essentially maintained her weight since stopping Mounjaro - last dose 04/27/2021.  Subjective:   1. Vitamin D deficiency Inconsistently taking weekly ergocalciferol.  2. Impaired fasting glucose, with polyphagia Previously on metformin - diarrhea. Last dose of Mounjaro 7.5mg  on 04/27/21- N/V after eating with profuse loose stools. She denies abdominal pain or GI upset at present.  3. Elevated liver function tests Denies RUQ pain.  4. At risk for osteoporosis Laura Werner is at higher risk of osteopenia and osteoporosis due to Vitamin D deficiency and obesity.   Assessment/Plan:   1. Vitamin D deficiency Continue weekly ergocalciferol. Check vitamin D level today.  - VITAMIN D 25 Hydroxy (Vit-D Deficiency, Fractures)  2. Impaired fasting glucose, with polyphagia Check labs today.  - Hemoglobin A1c - Insulin, random - Lipase  3. Elevated liver function tests Check labs today.  - Comprehensive metabolic panel  4. At risk for osteoporosis Laura Werner was given approximately 15 minutes of osteoporosis prevention counseling today. Laura Werner is at risk for osteopenia and osteoporosis due to her Vitamin D deficiency. She was encouraged to take her Vit D and follow her higher calcium diet and increase strengthening exercise to help strengthen her bones and decrease her risk of osteopenia  and osteoporosis.  5. Obesity, current BMI 44.8  Laura Werner is currently in the action stage of change. As such, her goal is to continue with weight loss efforts. She has agreed to keeping a food journal and adhering to recommended goals of 1500 calories and 100 grams of protein.   Convert to journaling plan.  Exercise goals:  As is.  Behavioral modification strategies: increasing lean protein intake, decreasing simple carbohydrates, meal planning and cooking strategies, keeping healthy foods in the home, planning for success, and keeping a strict food journal.  Laura Werner has agreed to follow-up with our clinic in 3 weeks. She was informed of the importance of frequent follow-up visits to maximize her success with intensive lifestyle modifications for her multiple health conditions.   Laura Werner was informed we would discuss her lab results at her next visit unless there is a critical issue that needs to be addressed sooner. Laura Werner agreed to keep her next visit at the agreed upon time to discuss these results.  Objective:   Blood pressure 103/69, pulse 73, temperature 98 F (36.7 C), height 5\' 7"  (1.702 m), weight 286 lb (129.7 kg), SpO2 98 %. Body mass index is 44.79 kg/m.  General: Cooperative, alert, well developed, in no acute distress. HEENT: Conjunctivae and lids unremarkable. Cardiovascular: Regular rhythm.  Lungs: Normal work of breathing. Neurologic: No focal deficits.   Lab Results  Component Value Date   CREATININE 0.82 05/03/2021   BUN 14 05/03/2021   NA 137 05/03/2021   K 3.1 (L) 05/03/2021   CL 99 05/03/2021   CO2 28 05/03/2021   Lab Results  Component Value Date   ALT 63 (  H) 05/03/2021   AST 30 05/03/2021   ALKPHOS 93 05/03/2021   BILITOT 0.6 05/03/2021   Lab Results  Component Value Date   HGBA1C 5.6 08/24/2020   HGBA1C 5.1 08/19/2019   HGBA1C 5.2 10/14/2017   Lab Results  Component Value Date   INSULIN 18.8 02/22/2021   INSULIN 21.4 08/24/2020   Lab Results   Component Value Date   TSH 3.430 08/24/2020   Lab Results  Component Value Date   CHOL 155 02/22/2021   HDL 44 02/22/2021   LDLCALC 97 02/22/2021   TRIG 69 02/22/2021   CHOLHDL 3.5 02/22/2021   Lab Results  Component Value Date   VD25OH 26.4 (L) 02/22/2021   VD25OH 21.2 (L) 08/24/2020   Lab Results  Component Value Date   WBC 10.9 (H) 05/03/2021   HGB 14.6 05/03/2021   HCT 43.0 05/03/2021   MCV 78.5 (L) 05/03/2021   PLT 306 05/03/2021   Attestation Statements:   Reviewed by clinician on day of visit: allergies, medications, problem list, medical history, surgical history, family history, social history, and previous encounter notes.  I, Water quality scientist, CMA, am acting as Location manager for Mina Marble, NP.  I have reviewed the above documentation for accuracy and completeness, and I agree with the above. -  Mandie Crabbe d. Glennys Schorsch, NP-C

## 2021-06-21 ENCOUNTER — Other Ambulatory Visit (INDEPENDENT_AMBULATORY_CARE_PROVIDER_SITE_OTHER): Payer: Self-pay | Admitting: Adult Health

## 2021-06-21 ENCOUNTER — Encounter (INDEPENDENT_AMBULATORY_CARE_PROVIDER_SITE_OTHER): Payer: Self-pay | Admitting: Adult Health

## 2021-06-21 DIAGNOSIS — E559 Vitamin D deficiency, unspecified: Secondary | ICD-10-CM

## 2021-06-21 LAB — COMPREHENSIVE METABOLIC PANEL
ALT: 26 IU/L (ref 0–32)
AST: 20 IU/L (ref 0–40)
Albumin/Globulin Ratio: 1.8 (ref 1.2–2.2)
Albumin: 4 g/dL (ref 3.8–4.8)
Alkaline Phosphatase: 115 IU/L (ref 44–121)
BUN/Creatinine Ratio: 22 (ref 9–23)
BUN: 16 mg/dL (ref 6–20)
Bilirubin Total: 0.3 mg/dL (ref 0.0–1.2)
CO2: 25 mmol/L (ref 20–29)
Calcium: 9.2 mg/dL (ref 8.7–10.2)
Chloride: 101 mmol/L (ref 96–106)
Creatinine, Ser: 0.73 mg/dL (ref 0.57–1.00)
Globulin, Total: 2.2 g/dL (ref 1.5–4.5)
Glucose: 113 mg/dL — ABNORMAL HIGH (ref 70–99)
Potassium: 3.7 mmol/L (ref 3.5–5.2)
Sodium: 141 mmol/L (ref 134–144)
Total Protein: 6.2 g/dL (ref 6.0–8.5)
eGFR: 111 mL/min/{1.73_m2} (ref >59)

## 2021-06-21 LAB — HEMOGLOBIN A1C
Est. average glucose Bld gHb Est-mCnc: 105 mg/dL
Hgb A1c MFr Bld: 5.3 % (ref 4.8–5.6)

## 2021-06-21 LAB — VITAMIN D 25 HYDROXY (VIT D DEFICIENCY, FRACTURES): Vit D, 25-Hydroxy: 29.3 ng/mL — ABNORMAL LOW (ref 30.0–100.0)

## 2021-06-21 LAB — INSULIN, RANDOM: INSULIN: 25.2 u[IU]/mL — ABNORMAL HIGH (ref 2.6–24.9)

## 2021-06-21 LAB — LIPASE: Lipase: 43 U/L (ref 14–72)

## 2021-06-21 MED ORDER — VITAMIN D (ERGOCALCIFEROL) 1.25 MG (50000 UNIT) PO CAPS: 50000.0000 [IU] | ORAL_CAPSULE | ORAL | 0 refills | Status: DC

## 2021-07-06 ENCOUNTER — Other Ambulatory Visit: Payer: Self-pay

## 2021-07-06 ENCOUNTER — Ambulatory Visit (HOSPITAL_BASED_OUTPATIENT_CLINIC_OR_DEPARTMENT_OTHER): Payer: 59 | Admitting: Nurse Practitioner

## 2021-07-06 ENCOUNTER — Encounter (HOSPITAL_BASED_OUTPATIENT_CLINIC_OR_DEPARTMENT_OTHER): Payer: Self-pay | Admitting: Nurse Practitioner

## 2021-07-06 VITALS — BP 122/72 | HR 88 | Ht 67.0 in | Wt 296.0 lb

## 2021-07-06 DIAGNOSIS — I10 Essential (primary) hypertension: Secondary | ICD-10-CM | POA: Diagnosis not present

## 2021-07-06 DIAGNOSIS — F900 Attention-deficit hyperactivity disorder, predominantly inattentive type: Secondary | ICD-10-CM

## 2021-07-06 DIAGNOSIS — R7301 Impaired fasting glucose: Secondary | ICD-10-CM | POA: Diagnosis not present

## 2021-07-06 DIAGNOSIS — Z6841 Body Mass Index (BMI) 40.0 and over, adult: Secondary | ICD-10-CM

## 2021-07-06 DIAGNOSIS — E7800 Pure hypercholesterolemia, unspecified: Secondary | ICD-10-CM

## 2021-07-06 MED ORDER — AMPHETAMINE-DEXTROAMPHET ER 30 MG PO CP24: 30.0000 mg | ORAL_CAPSULE | Freq: Every day | ORAL | 0 refills | Status: DC

## 2021-07-06 NOTE — Patient Instructions (Signed)
Thank you for choosing [place] at [facility] for your Primary Care needs. I am excited for the opportunity to partner with you to meet your health care goals. It was a pleasure meeting you today!  Recommendations from today's visit: I will increase the adderall to $RemoveBef'30mg'ytaSujMxzz$  a day. If this does not seem to work well for you or if you have negative side effects, please let me know I am going to reach out to Dr. Juleen China and get her opinion on the GLP-1 restart and will let you know something about this.   Information on diet, exercise, and health maintenance recommendations are listed below. This is information to help you be sure you are on track for optimal health and monitoring.   Please look over this and let us know if you have any questions or if you have completed any of the health maintenance outside of [place] so that we can be sure your records are up to date.  ___________________________________________________________ About Me: I am an Adult-Geriatric Nurse Practitioner with a background in caring for patients for more than 20 years with a strong intensive care background. I provide primary care and sports medicine services to patients age 35 and older within this office. My education had a strong focus on caring for the older adult population, which I am passionate about. I am also the director of the APP Fellowship with [facility].   My desire is to provide you with the best service through preventive medicine and supportive care. I consider you a part of the medical team and value your input. I work diligently to ensure that you are heard and your needs are met in a safe and effective manner. I want you to feel comfortable with me as your provider and want you to know that your health concerns are important to me.  For your information, our office hours are: Monday, Tuesday, and Thursday 8:00 AM - 5:00 PM Wednesday and Friday 8:00 AM - 12:00 PM.   In my time  away from the office I am teaching new APP's within the system and am unavailable, but my partner, Dr. Burnard Bunting is in the office for emergent needs.   If you have questions or concerns, please call our office at [phone] or send [place] a MyChart message and we will respond as quickly as possible.  ____________________________________________________________ MyChart:  For all urgent or time sensitive needs we ask that you please call the office to avoid delays. Our number is [phone]. MyChart is not constantly monitored and due to the large volume of messages a day, replies may take up to 72 business hours.  MyChart Policy: MyChart allows for you to see your visit notes, after visit summary, provider recommendations, lab and tests results, make an appointment, request refills, and contact your provider or the office for non-urgent questions or concerns. Providers are seeing patients during normal business hours and do not have built in time to review MyChart messages.  We ask that you allow a minimum of 3 business days for responses to [employer]. For this reason, please do not send urgent requests through [place]. Please call the office at [phone]. New and ongoing conditions may require a visit. We have virtual and in person visit available for your convenience.  Complex MyChart concerns may require a visit. Your provider may request you schedule a virtual or in person visit to ensure we are providing the best care possible. MyChart messages sent after 11:00 AM  on Friday will not be received by the provider until Monday morning.    Lab and Test Results: You will receive your lab and test results on MyChart as soon as they are completed and results have been sent by the lab or testing facility. Due to this service, you will receive your results BEFORE your provider.  I review lab and tests results each morning prior to seeing patients. Some results require collaboration with other  providers to ensure you are receiving the most appropriate care. For this reason, we ask that you please allow a minimum of 3-5 business days from the time the ALL results have been received for your provider to receive and review lab and test results and contact you about these.  Most lab and test result comments from the provider will be sent through [place]. Your provider may recommend changes to the plan of care, follow-up visits, repeat testing, ask questions, or request an office visit to discuss these results. You may reply directly to this message or call the office at [phone] to provide information for the provider or set up an appointment. In some instances, you will be called with test results and recommendations. Please let us know if this is preferred and we will make note of this in your chart to provide this for you.    If you have not heard a response to your lab or test results in 5 business days from all results returning to [place], please call the office to let us know. We ask that you please avoid calling prior to this time unless there is an emergent concern. Due to high call volumes, this can delay the resulting process.  After Hours: For all non-emergency after hours needs, please call the office at [phone] and select the option to reach the on-call provider service. On-call services are shared between multiple [place] offices and therefore it will not be possible to speak directly with your provider. On-call providers may provide medical advice and recommendations, but are unable to provide refills for maintenance medications.  For all emergency or urgent medical needs after normal business hours, we recommend that you seek care at the closest Urgent Care or Emergency Department to ensure appropriate treatment in a timely manner.  [facility] has a 24 hour emergency room located on the ground floor for your convenience.   Urgent Concerns During  the Business Day Providers are seeing patients from 8AM to [place] with a busy schedule and are most often not able to respond to non-urgent calls until the end of the day or the next business day. If you should have URGENT concerns during the day, please call and speak to the nurse or schedule a same day appointment so that we can address your concern without delay.   Thank you, again, for choosing me as your health care partner. I appreciate your trust and look forward to learning more about you.   Laura Keeler, DNP, AGNP-c ___________________________________________________________  Health Maintenance Recommendations Screening Testing Mammogram Every 1 -2 years based on history and risk factors Starting at age 11 Pap Smear Ages 21-39 every 3 years Ages 34-65 every 5 years with HPV testing More frequent testing may be required based on results and history Colon Cancer Screening Every 1-10 years based on test performed, risk factors, and history Starting at age 89 Bone Density Screening Every 2-10 years based on history Starting at age 61 for women Recommendations for men differ based on medication usage,  history, and risk factors AAA Screening One time ultrasound Men 38-52 years old who have every smoked Lung Cancer Screening Low Dose Lung CT every 12 months Age 33-80 years with a 30 pack-year smoking history who still smoke or who have quit within the last 15 years  Screening Labs Routine  Labs: Complete Blood Count (CBC), Complete Metabolic Panel (CMP), Cholesterol (Lipid Panel) Every 6-12 months based on history and medications May be recommended more frequently based on current conditions or previous results Hemoglobin A1c Lab Every 3-12 months based on history and previous results Starting at age 71 or earlier with diagnosis of diabetes, high cholesterol, BMI >26, and/or risk factors Frequent monitoring for patients with diabetes to ensure blood sugar control Thyroid  Panel (TSH w/ T3 & T4) Every 6 months based on history, symptoms, and risk factors May be repeated more often if on medication HIV One time testing for all patients 25 and older May be repeated more frequently for patients with increased risk factors or exposure Hepatitis C One time testing for all patients 54 and older May be repeated more frequently for patients with increased risk factors or exposure Gonorrhea, Chlamydia Every 12 months for all sexually active persons 13-24 years Additional monitoring may be recommended for those who are considered high risk or who have symptoms PSA Men 52-40 years old with risk factors Additional screening may be recommended from age 34-69 based on risk factors, symptoms, and history  Vaccine Recommendations Tetanus Booster All adults every 10 years Flu Vaccine All patients 6 months and older every year COVID Vaccine All patients 12 years and older Initial dosing with booster May recommend additional booster based on age and health history HPV Vaccine 2 doses all patients age 24-26 Dosing may be considered for patients over 26 Shingles Vaccine (Shingrix) 2 doses all adults 85 years and older Pneumonia (Pneumovax 23) All adults 72 years and older May recommend earlier dosing based on health history Pneumonia (Prevnar 28) All adults 55 years and older Dosed 1 year after Pneumovax 23  Additional Screening, Testing, and Vaccinations may be recommended on an individualized basis based on family history, health history, risk factors, and/or exposure.  __________________________________________________________  Diet Recommendations for All Patients  I recommend that all patients maintain a diet low in saturated fats, carbohydrates, and cholesterol. While this can be challenging at first, it is not impossible and small changes can make big differences.  Things to try: Decreasing the amount of soda, sweet tea, and/or juice to one or less per day  and replace with water While water is always the first choice, if you do not like water you may consider adding a water additive without sugar to improve the taste other sugar free drinks Replace potatoes with a brightly colored vegetable at dinner Use healthy oils, such as canola oil or olive oil, instead of butter or hard margarine Limit your bread intake to two pieces or less a day Replace regular pasta with low carb pasta options Bake, broil, or grill foods instead of frying Monitor portion sizes  Eat smaller, more frequent meals throughout the day instead of large meals  An important thing to remember is, if you love foods that are not great for your health, you don't have to give them up completely. Instead, allow these foods to be a reward when you have done well. Allowing yourself to still have special treats every once in a while is a nice way to tell yourself thank you for working hard  to keep yourself healthy.   Also remember that every day is a new day. If you have a bad day and "fall off the wagon", you can still climb right back up and keep moving along on your journey!  We have resources available to help you!  Some websites that may be helpful include: www.http://carter.biz/  Www.VeryWellFit.com _____________________________________________________________  Activity Recommendations for All Patients  I recommend that all adults get at least 20 minutes of moderate physical activity that elevates your heart rate at least 5 days out of the week.  Some examples include: Walking or jogging at a pace that allows you to carry on a conversation Cycling (stationary bike or outdoors) Water aerobics Yoga Weight lifting Dancing If physical limitations prevent you from putting stress on your joints, exercise in a pool or seated in a chair are excellent options.  Do determine your MAXIMUM heart rate for activity: YOUR AGE - 220 = MAX HeartRate   Remember! Do not push yourself too hard.   Start slowly and build up your pace, speed, weight, time in exercise, etc.  Allow your body to rest between exercise and get good sleep. You will need more water than normal when you are exerting yourself. Do not wait until you are thirsty to drink. Drink with a purpose of getting in at least 8, 8 ounce glasses of water a day plus more depending on how much you exercise and sweat.    If you begin to develop dizziness, chest pain, abdominal pain, jaw pain, shortness of breath, headache, vision changes, lightheadedness, or other concerning symptoms, stop the activity and allow your body to rest. If your symptoms are severe, seek emergency evaluation immediately. If your symptoms are concerning, but not severe, please let us know so that we can recommend further evaluation.

## 2021-07-06 NOTE — Progress Notes (Signed)
Orma Render, DNP, AGNP-c [facility] [address]   [place], [place] [address] [phone]  New patient visit   Patient: Laura Werner   DOB: November 16, 1985   36 y.o. Female  MRN: [id number] Visit Date: 07/06/2021  Patient Care Team: Chemere Steffler, Coralee Pesa, NP as PCP - General (Nurse Practitioner)  Today's healthcare provider: Orma Render, NP   Chief Complaint  Patient presents with   New Patient (Initial Visit)    Patient presents today to establish care. Would like to discuss glucose intolerance. Been going to healthy weight wellness. Would like to discuss adderall    Subjective    Laura Werner is a 36 y.o. female who presents today as a new patient to establish care.    Patient endorses the following concerns presently: Insulin Resistance Patient endorses: - Has been on mounjaro in the past- lost 35 pounds - when dosage was increased ended up with elevated pancreatic enzymes, but no pain or inflammation in the pancreas - recent visit with [facility] provider recommended she not restart due to previous elevation in pancreatic enzymes - patient would like to restart if possible. - tried metformin in the past and had severe diarrhea - no nausea, vomiting, abdominal pain, hx of thyroid ca  Adderall Patient endorses: - currently taking '20mg'$  XR daily, feels dose helps some but not completely effective. - no interference with sleep, increased anxiety, weight loss, decreased appetite, or palpitations - difficulty concentrating and focusing when not on medication - does not taking daily, only when working or needing to focus - no withdrawal symptoms when not taken.   Due for pap- significant anxiety, would like medication prior to procedure, if possible.    History reviewed and reveals the following: Past Medical History:  Diagnosis Date   ADHD (attention deficit hyperactivity disorder), combined type    Benign joint  hypermobility    HTN (hypertension)    Insulin resistance    Nausea and vomiting 05/03/2021   Obesity    Past Surgical History:  Procedure Laterality Date   ARTHROSCOPIC REPAIR ACL  2018   Family Status  Relation Name Status   MGM  Deceased   PGM  Deceased   PGF  Deceased   Mother  Alive   Father  Alive   Other  (Not Specified)   Brother  Alive   Family History  Problem Relation Age of Onset   Melanoma Maternal Grandmother    Lung cancer Paternal Grandmother    Lung cancer Paternal Grandfather    Depression Mother    Hypertension Mother    Diabetes Mother    Breast cancer Mother 47       triple negative   Anxiety disorder Mother    Obesity Mother    Alcohol abuse Father    Cancer Father    Depression Father    Obesity Father    Hypertension Other    Healthy Brother    Social History   Socioeconomic History   Marital status: Significant Other    Spouse name: Clyda Hurdle   Number of children: Not on file   Years of education: Not on file   Highest education level: Master's degree (e.g., MA, MS, MEng, MEd, MSW, MBA)  Occupational History   Occupation: Psychologist, 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    Employer: [employer]  Tobacco Use   Smoking status: Never   Smokeless tobacco: Never  Vaping Use   Vaping Use: Never used  Substance and Sexual Activity   Alcohol use: Yes    Comment: 1-2 drinks/week or less   Drug use: No   Sexual activity: Not Currently    Partners: Male  Other Topics Concern   Not on file  Social History Narrative   Not on file   Social Determinants of Health   Financial Resource Strain: Not on file  Food Insecurity: Not on file  Transportation Needs: Not on file  Physical Activity: Not on file  Stress: Not on file  Social Connections: Not on file   Outpatient Medications Prior to Visit  Medication Sig   lisinopril-hydrochlorothiazide (ZESTORETIC) 20-25 MG tablet Take 1 tablet by mouth daily.   ondansetron (ZOFRAN-ODT) 4 MG disintegrating  tablet Take 1 tablet (4 mg total) by mouth every 8 (eight) hours as needed for nausea or vomiting.   Vitamin D, Ergocalciferol, (DRISDOL) 1.25 MG (50000 UNIT) CAPS capsule Take 1 capsule (50,000 Units total) by mouth every 7 (seven) days.   [DISCONTINUED] amphetamine-dextroamphetamine (ADDERALL XR) 20 MG 24 hr capsule Take 1 capsule (20 mg total) by mouth daily.   [DISCONTINUED] amphetamine-dextroamphetamine (ADDERALL XR) 20 MG 24 hr capsule Take 1 capsule (20 mg total) by mouth daily.   [DISCONTINUED] amphetamine-dextroamphetamine (ADDERALL XR) 20 MG 24 hr capsule Take 1 capsule (20 mg total) by mouth daily.   [DISCONTINUED] amphetamine-dextroamphetamine (ADDERALL XR) 20 MG 24 hr capsule Take 1 capsule (20 mg total) by mouth daily.   [DISCONTINUED] amphetamine-dextroamphetamine (ADDERALL XR) 20 MG 24 hr capsule Take 1 capsule (20 mg total) by mouth daily.   [DISCONTINUED] amphetamine-dextroamphetamine (ADDERALL XR) 20 MG 24 hr capsule Take 1 capsule (20 mg total) by mouth daily.   No facility-administered medications prior to visit.   No Known Allergies Immunization History  Administered Date(s) Administered   Influenza,inj,Quad PF,6+ Mos 01/06/2020   Influenza-Unspecified 02/07/2018   PFIZER(Purple Top)SARS-COV-2 Vaccination 05/15/2019, 06/05/2019, 02/17/2020   Tdap 08/15/2010    Health Maintenance Due: Health Maintenance  Topic Date Due   COVID-19 Vaccine (4 - Booster for Pfizer series) 04/13/2020   TETANUS/TDAP  08/14/2020   PAP SMEAR-Modifier  10/29/2020   INFLUENZA VACCINE  07/28/2021 (Originally 11/28/2020)   Hepatitis C Screening  09/08/2021 (Originally 07/21/2004)   HIV Screening  Completed   HPV VACCINES  Aged Out    Review of Systems All review of systems negative except what is listed in the HPI   Objective    BP 122/72    Pulse 88    Ht '5\' 7"'$  (1.702 m)    Wt 296 lb (134.3 kg)    SpO2 98%    BMI 46.36 kg/m  Physical Exam Vitals and nursing note reviewed.   Constitutional:      General: She is not in acute distress.    Appearance: Normal appearance.  Eyes:     Extraocular Movements: Extraocular movements intact.     Conjunctiva/sclera: Conjunctivae normal.     Pupils: Pupils are equal, round, and reactive to light.  Neck:     Vascular: No carotid bruit.  Cardiovascular:     Rate and Rhythm: Normal rate and regular rhythm.     Pulses: Normal pulses.     Heart sounds: Normal heart sounds. No murmur heard. Pulmonary:     Effort: Pulmonary effort is normal.     Breath sounds: Normal breath sounds. No wheezing.  Abdominal:     General: Bowel sounds are normal.     Palpations: Abdomen is soft.  Musculoskeletal:  General: Normal range of motion.     Cervical back: Normal range of motion.     Right lower leg: No edema.     Left lower leg: No edema.  Skin:    General: Skin is warm and dry.     Capillary Refill: Capillary refill takes less than 2 seconds.  Neurological:     General: No focal deficit present.     Mental Status: She is alert and oriented to person, place, and time.  Psychiatric:        Mood and Affect: Mood normal.        Behavior: Behavior normal.        Thought Content: Thought content normal.        Judgment: Judgment normal.    No results found for any visits on 07/06/21.  Assessment & Plan      Problem List Items Addressed This Visit     Attention deficit hyperactivity disorder (ADHD) - Primary    Improved but not completely controlled sx with 20 mg adderall xr. Will plan to trial increase to '30mg'$  to see if this helps her achieve improved control. Recommend monitoring closely for side effects and continue to take holidays from medication when focus and attention are not required.  Will f/u in 3 months or sooner if needed.       Relevant Medications   amphetamine-dextroamphetamine (ADDERALL XR) 30 MG 24 hr capsule   amphetamine-dextroamphetamine (ADDERALL XR) 30 MG 24 hr capsule (Start on 08/03/2021)    amphetamine-dextroamphetamine (ADDERALL XR) 30 MG 24 hr capsule (Start on 08/31/2021)   Essential hypertension    Chronic. Well controlled. No alarm sx present.  No medication changes at this time.  Continue current meds. Labs reviewed from [facility].  Will plan to f/u in 12 months as long as no new sx and pt remains stable.       Impaired fasting glucose    Recent successful weight loss with mounjaro through [facility].  Increase in pancreatic enzymes with increased dose but no evidence of inflammation or pain present. Pancreatitis ruled out with CT.  At this time, it appears that the increased dose did cause a pancreatic reaction, but this was caught Mariaguadalupe Fialkowski. I feel it would be reasonable to consider restarting on a low dose and titrating very slow with close monitoring, but recommend patient discuss with Dr. Juleen China at [facility] for her opinion. Will continue to monitor.        Elevated LDL cholesterol level   Class 3 severe obesity with serious comorbidity and body mass index (BMI) of 45.0 to 49.9 in adult [facility])   Relevant Medications   amphetamine-dextroamphetamine (ADDERALL XR) 30 MG 24 hr capsule   amphetamine-dextroamphetamine (ADDERALL XR) 30 MG 24 hr capsule (Start on 08/03/2021)   amphetamine-dextroamphetamine (ADDERALL XR) 30 MG 24 hr capsule (Start on 08/31/2021)     Return in about 3 months (around 10/06/2021) for ADHD f/u and pap.      Chaunta Bejarano, Coralee Pesa, NP, DNP, AGNP-C [facility] at [place]

## 2021-07-10 NOTE — Assessment & Plan Note (Signed)
Recent successful weight loss with mounjaro through [facility].  ?Increase in pancreatic enzymes with increased dose but no evidence of inflammation or pain present. Pancreatitis ruled out with CT.  ?At this time, it appears that the increased dose did cause a pancreatic reaction, but this was caught Renan Danese. I feel it would be reasonable to consider restarting on a low dose and titrating very slow with close monitoring, but recommend patient discuss with Dr. Juleen China at [facility] for her opinion. Will continue to monitor.  ? ?

## 2021-07-10 NOTE — Assessment & Plan Note (Signed)
Chronic. Well controlled. No alarm sx present.  ?No medication changes at this time.  ?Continue current meds. Labs reviewed from [place].  ?Will plan to f/u in 12 months as long as no new sx and pt remains stable.  ?

## 2021-07-10 NOTE — Assessment & Plan Note (Signed)
Improved but not completely controlled sx with 20 mg adderall xr. Will plan to trial increase to '30mg'$  to see if this helps her achieve improved control. ?Recommend monitoring closely for side effects and continue to take holidays from medication when focus and attention are not required.  ?Will f/u in 3 months or sooner if needed.  ?

## 2021-07-13 ENCOUNTER — Other Ambulatory Visit: Payer: Self-pay

## 2021-07-13 ENCOUNTER — Encounter (INDEPENDENT_AMBULATORY_CARE_PROVIDER_SITE_OTHER): Payer: Self-pay | Admitting: Family Medicine

## 2021-07-13 ENCOUNTER — Ambulatory Visit (INDEPENDENT_AMBULATORY_CARE_PROVIDER_SITE_OTHER): Payer: 59 | Admitting: Family Medicine

## 2021-07-13 VITALS — BP 115/72 | HR 82 | Temp 98.0°F | Ht 67.0 in | Wt 287.0 lb

## 2021-07-13 DIAGNOSIS — I10 Essential (primary) hypertension: Secondary | ICD-10-CM | POA: Diagnosis not present

## 2021-07-13 DIAGNOSIS — E669 Obesity, unspecified: Secondary | ICD-10-CM

## 2021-07-13 DIAGNOSIS — R7301 Impaired fasting glucose: Secondary | ICD-10-CM

## 2021-07-13 DIAGNOSIS — E559 Vitamin D deficiency, unspecified: Secondary | ICD-10-CM | POA: Diagnosis not present

## 2021-07-13 DIAGNOSIS — Z6841 Body Mass Index (BMI) 40.0 and over, adult: Secondary | ICD-10-CM

## 2021-07-13 MED ORDER — VITAMIN D (ERGOCALCIFEROL) 1.25 MG (50000 UNIT) PO CAPS: 50000.0000 [IU] | ORAL_CAPSULE | ORAL | 0 refills | Status: DC

## 2021-07-24 NOTE — Progress Notes (Signed)
Chief Complaint:   OBESITY Laura Werner is here to discuss her progress with her obesity treatment plan along with follow-up of her obesity related diagnoses. See Medical Weight Management Flowsheet for complete bioelectrical impedance results.  Today's visit was #: 30 Starting weight: 313 lbs Starting date: 08/24/2020 Weight change since last visit:+1 lb Total lbs lost to date: 26 lb Total weight loss percentage to date: -8.31%  Nutrition Plan: Keeping a food journal and adhering to recommended goals of 30-45 calories and 100 grams of protein daily for 50% of the time. Activity: Walking for 30-45 minutes 3 times per week.  Interim History: Reviewed recent notes - GLP-1RA with increase in lipase.  Frustrated.  Considering bariatric surgery.  Assessment/Plan:   1. Vitamin D insufficiency Not at goal. She is taking vitamin D 50,000 IU weekly.  Plan: Continue to take prescription Vitamin D '@50'$ ,000 IU every week as prescribed.  Follow-up for routine testing of Vitamin D, at least 2-3 times per year to avoid over-replacement.  Lab Results  Component Value Date   VD25OH 29.3 (L) 06/20/2021   VD25OH 26.4 (L) 02/22/2021   VD25OH 21.2 (L) 08/24/2020   - Refill Vitamin D, Ergocalciferol, (DRISDOL) 1.25 MG (50000 UNIT) CAPS capsule; Take 1 capsule (50,000 Units total) by mouth every 7 (seven) days.  Dispense: 4 capsule; Refill: 0  2. Impaired fasting glucose, with polpyhagia Not at goal. Current treatment: None.    Plan: She will continue to focus on protein-rich, low simple carbohydrate foods. We reviewed the importance of hydration, regular exercise for stress reduction, and restorative sleep.  3. Essential hypertension At goal. Medications: Zestoretic 20.25 mg daily.   Plan: Avoid buying foods that are: processed, frozen, or prepackaged to avoid excess salt. We will watch for signs of hypotension as she continues lifestyle modifications.  BP Readings from Last 3 Encounters:   07/13/21 115/72  07/06/21 122/72  06/20/21 103/69   Lab Results  Component Value Date   CREATININE 0.73 06/20/2021   4. Obesity, current BMI 45  Course: Laura Werner is currently in the action stage of change. As such, her goal is to continue with weight loss efforts.   Nutrition goals: She has agreed to keeping a food journal and adhering to recommended goals of 1500 calories and 110 grams of protein.   Exercise goals:  As is.  Behavioral modification strategies: increasing lean protein intake and decreasing simple carbohydrates.  Oliviagrace has agreed to follow-up with our clinic in 4 weeks. She was informed of the importance of frequent follow-up visits to maximize her success with intensive lifestyle modifications for her multiple health conditions.   Objective:   Blood pressure 115/72, pulse 82, temperature 98 F (36.7 C), height '5\' 7"'$  (1.702 m), weight 287 lb (130.2 kg), last menstrual period 07/06/2021, SpO2 98 %. Body mass index is 44.95 kg/m.  General: Cooperative, alert, well developed, in no acute distress. HEENT: Conjunctivae and lids unremarkable. Cardiovascular: Regular rhythm.  Lungs: Normal work of breathing. Neurologic: No focal deficits.   Lab Results  Component Value Date   CREATININE 0.73 06/20/2021   BUN 16 06/20/2021   NA 141 06/20/2021   K 3.7 06/20/2021   CL 101 06/20/2021   CO2 25 06/20/2021   Lab Results  Component Value Date   ALT 26 06/20/2021   AST 20 06/20/2021   ALKPHOS 115 06/20/2021   BILITOT 0.3 06/20/2021   Lab Results  Component Value Date   HGBA1C 5.3 06/20/2021   HGBA1C 5.6 08/24/2020  HGBA1C 5.1 08/19/2019   HGBA1C 5.2 10/14/2017   Lab Results  Component Value Date   INSULIN 25.2 (H) 06/20/2021   INSULIN 18.8 02/22/2021   INSULIN 21.4 08/24/2020   Lab Results  Component Value Date   TSH 3.430 08/24/2020   Lab Results  Component Value Date   CHOL 155 02/22/2021   HDL 44 02/22/2021   LDLCALC 97 02/22/2021   TRIG 69  02/22/2021   CHOLHDL 3.5 02/22/2021   Lab Results  Component Value Date   VD25OH 29.3 (L) 06/20/2021   VD25OH 26.4 (L) 02/22/2021   VD25OH 21.2 (L) 08/24/2020   Lab Results  Component Value Date   WBC 10.9 (H) 05/03/2021   HGB 14.6 05/03/2021   HCT 43.0 05/03/2021   MCV 78.5 (L) 05/03/2021   PLT 306 05/03/2021   Attestation Statements:   Reviewed by clinician on day of visit: allergies, medications, problem list, medical history, surgical history, family history, social history, and previous encounter notes.  I, Water quality scientist, CMA, am acting as transcriptionist for Briscoe Deutscher, DO  I have reviewed the above documentation for accuracy and completeness, and I agree with the above. -  Briscoe Deutscher, DO, MS, FAAFP, DABOM - Family and Bariatric Medicine.

## 2021-07-26 ENCOUNTER — Encounter (INDEPENDENT_AMBULATORY_CARE_PROVIDER_SITE_OTHER): Payer: Self-pay | Admitting: Family Medicine

## 2021-08-15 ENCOUNTER — Ambulatory Visit (INDEPENDENT_AMBULATORY_CARE_PROVIDER_SITE_OTHER): Payer: 59 | Admitting: Adult Health

## 2021-10-09 ENCOUNTER — Other Ambulatory Visit (HOSPITAL_COMMUNITY): Payer: Self-pay | Admitting: General Surgery

## 2021-10-09 ENCOUNTER — Other Ambulatory Visit: Payer: Self-pay | Admitting: General Surgery

## 2021-10-12 ENCOUNTER — Encounter (HOSPITAL_BASED_OUTPATIENT_CLINIC_OR_DEPARTMENT_OTHER): Payer: Self-pay | Admitting: Nurse Practitioner

## 2021-10-12 ENCOUNTER — Ambulatory Visit (HOSPITAL_BASED_OUTPATIENT_CLINIC_OR_DEPARTMENT_OTHER): Payer: 59 | Admitting: Nurse Practitioner

## 2021-10-12 VITALS — BP 122/85 | HR 67 | Ht 67.0 in | Wt 294.1 lb

## 2021-10-12 DIAGNOSIS — Z124 Encounter for screening for malignant neoplasm of cervix: Secondary | ICD-10-CM | POA: Insufficient documentation

## 2021-10-12 DIAGNOSIS — Z0000 Encounter for general adult medical examination without abnormal findings: Secondary | ICD-10-CM | POA: Insufficient documentation

## 2021-10-12 DIAGNOSIS — F900 Attention-deficit hyperactivity disorder, predominantly inattentive type: Secondary | ICD-10-CM | POA: Diagnosis not present

## 2021-10-12 DIAGNOSIS — Z6841 Body Mass Index (BMI) 40.0 and over, adult: Secondary | ICD-10-CM

## 2021-10-12 DIAGNOSIS — R7301 Impaired fasting glucose: Secondary | ICD-10-CM | POA: Diagnosis not present

## 2021-10-12 DIAGNOSIS — J3089 Other allergic rhinitis: Secondary | ICD-10-CM

## 2021-10-12 DIAGNOSIS — I10 Essential (primary) hypertension: Secondary | ICD-10-CM | POA: Diagnosis not present

## 2021-10-12 MED ORDER — AMPHETAMINE-DEXTROAMPHET ER 20 MG PO CP24: 20.0000 mg | ORAL_CAPSULE | ORAL | 0 refills | Status: DC

## 2021-10-12 MED ORDER — LEVOCETIRIZINE DIHYDROCHLORIDE 5 MG PO TABS: 5.0000 mg | ORAL_TABLET | Freq: Every evening | ORAL | 3 refills | Status: DC

## 2021-10-12 NOTE — Patient Instructions (Addendum)
I have sent in the adderall for you in the '20mg'$  dose.   I have sent in the xyzal for you to try for allergies.

## 2021-10-12 NOTE — Progress Notes (Signed)
Worthy Keeler, DNP, AGNP-c [place], [place] [address] [phone] Office [phone] Fax  ESTABLISHED PATIENT- Chronic Health and/or Follow-Up Visit  Blood pressure 122/85, pulse 67, height '5\' 7"'$  (1.702 m), weight 294 lb 1.6 oz (133.4 kg), SpO2 99 %.  Follow-up (Pt here for ADHD f/u, pt stated the 30 mg of adderall is too much for her she stated it makes her heart rate go up)   HPI  Laura Werner  is a 36 y.o. year old female presenting today for evaluation and management of the following: ADHD Recent increase to '30mg'$  adderall has resulted in side effects of elevated heart rate She feels as though the attention and focus is appropriate however due to the side effects would like a reduced dosage of this She has not had any interference with sleep or worsening anxiety She has been taking medication as prescribed Weight Seeing Dr. Juleen China with [name] for weight management Has been trying mounjaro every other week to help with appetite reduction and intake control At this time feels that this weight management tool Has routinely had labs completed with weight management HTN Chronic and typically well controlled Currently taking lisinopril hydrochlorothiazide for management  Denies headache, vision changes, chest pain, dizziness, shortness of breath, lower extremity edema No side effects from the medication that she is aware of Taking medication as prescribed Seasonal allergies Endorses concerns with seasonal allergies that are not well controlled at this time. Has historically been taking over-the-counter medication  ROS All ROS negative with exception of what is listed in HPI  PHYSICAL EXAM Physical Exam Vitals and nursing note reviewed.  Constitutional:      General: She is not in acute distress.    Appearance: Normal appearance.  HENT:     Head: Normocephalic.  Eyes:     Extraocular  Movements: Extraocular movements intact.     Conjunctiva/sclera: Conjunctivae normal.     Pupils: Pupils are equal, round, and reactive to light.  Neck:     Vascular: No carotid bruit.  Cardiovascular:     Rate and Rhythm: Normal rate and regular rhythm.     Pulses: Normal pulses.     Heart sounds: Normal heart sounds. No murmur heard. Pulmonary:     Effort: Pulmonary effort is normal.     Breath sounds: Normal breath sounds. No wheezing.  Abdominal:     General: Bowel sounds are normal. There is no distension.     Palpations: Abdomen is soft.     Tenderness: There is no abdominal tenderness. There is no guarding.  Musculoskeletal:        General: Normal range of motion.     Cervical back: Normal range of motion and neck supple.     Right lower leg: No edema.     Left lower leg: No edema.  Lymphadenopathy:     Cervical: No cervical adenopathy.  Skin:    General: Skin is warm and dry.     Capillary Refill: Capillary refill takes less than 2 seconds.  Neurological:     General: No focal deficit present.     Mental Status: She is alert and oriented to person, place, and time.  Psychiatric:        Mood and Affect: Mood normal.        Behavior: Behavior normal.        Thought Content: Thought content normal.        Judgment: Judgment normal.  ASSESSMENT & PLAN Problem List Items Addressed This Visit     Class 3 severe obesity with serious comorbidity and body mass index (BMI) of 45.0 to 49.9 in adult [facility])    Currently following with Dr. Juleen China with medical management.  Successful ongoing weight loss has been achieved.  Recommend continuation of diet and exercise modifications to help further promote weight loss and overall health.      Relevant Medications   amphetamine-dextroamphetamine (ADDERALL XR) 20 MG 24 hr capsule (Start on 12/07/2021)   amphetamine-dextroamphetamine (ADDERALL XR) 20 MG 24 hr capsule (Start on 11/09/2021)   amphetamine-dextroamphetamine (ADDERALL  XR) 20 MG 24 hr capsule   Attention deficit hyperactivity disorder (ADHD) - Primary    Chronic.  Recent start of Adderall 30 mg daily did cause increased palpitations and heart rate inpatient.  Given these adverse findings discussed with patient the recommendation to reduce the dose to 20 mg daily.  She is in agreement to this.  We will continue to monitor for side effects closely.  Given the history of hypertension also recommend at home blood pressure monitoring to ensure that blood pressure is not being affected by stimulant medication.  Patient is in agreement.  We will plan to follow-up in 3 months with ADHD check or sooner if needed.      Relevant Medications   amphetamine-dextroamphetamine (ADDERALL XR) 20 MG 24 hr capsule (Start on 12/07/2021)   amphetamine-dextroamphetamine (ADDERALL XR) 20 MG 24 hr capsule (Start on 11/09/2021)   amphetamine-dextroamphetamine (ADDERALL XR) 20 MG 24 hr capsule   Essential hypertension    Chronic.  Well-controlled on current regimen.  No alarm symptoms present today. Recommend continuation of current medication with no changes.  Recent labs have been completed with weight management. We will plan to follow-up in 9 months or sooner as needed.      Impaired fasting glucose    Chronic.  She is currently working with weight management and has had successful weight loss with Mounjaro.  Previously she did have some elevation of her pancreatic enzymes however no additional symptoms were noted.  Pancreatitis was ruled out with CT.  Joint decision with her and Dr. Juleen China with weight management was made to restart Mounjaro at low dose with every other week dosing and monitor very closely for any new abdominal pain or symptoms.  Dr. Juleen China is currently measuring labs frequently.  Recommend continuation with very slow titration as tolerated.  If any signs of pancreatitis prevent immediate discontinuation of the medication must be made.      Environmental and seasonal  allergies    Chronic.  Currently not well controlled on over-the-counter medication.  Recommend levocetirizine at bedtime for treatment.  We will send medication to pharmacy today.  Patient will follow-up if symptoms worsen or fail to improve.  No alarm symptoms are present at this time.      Relevant Medications   levocetirizine (XYZAL) 5 MG tablet     FOLLOW-UP Return in about 3 months (around 01/12/2022) for virtual visit adhd.   Worthy Keeler, DNP, AGNP-c

## 2021-10-26 ENCOUNTER — Encounter (HOSPITAL_BASED_OUTPATIENT_CLINIC_OR_DEPARTMENT_OTHER): Payer: Self-pay | Admitting: Nurse Practitioner

## 2021-10-26 NOTE — Assessment & Plan Note (Signed)
Chronic.  She is currently working with weight management and has had successful weight loss with Mounjaro.  Previously she did have some elevation of her pancreatic enzymes however no additional symptoms were noted.  Pancreatitis was ruled out with CT.  Joint decision with her and Dr. Juleen China with weight management was made to restart Mounjaro at low dose with every other week dosing and monitor very closely for any new abdominal pain or symptoms.  Dr. Juleen China is currently measuring labs frequently.  Recommend continuation with very slow titration as tolerated.  If any signs of pancreatitis prevent immediate discontinuation of the medication must be made.

## 2021-10-26 NOTE — Assessment & Plan Note (Signed)
Chronic.  Currently not well controlled on over-the-counter medication.  Recommend levocetirizine at bedtime for treatment.  We will send medication to pharmacy today.  Patient will follow-up if symptoms worsen or fail to improve.  No alarm symptoms are present at this time.

## 2021-10-26 NOTE — Assessment & Plan Note (Signed)
Currently following with Dr. Juleen China with medical management.  Successful ongoing weight loss has been achieved.  Recommend continuation of diet and exercise modifications to help further promote weight loss and overall health.

## 2021-10-26 NOTE — Assessment & Plan Note (Signed)
Chronic.  Recent start of Adderall 30 mg daily did cause increased palpitations and heart rate inpatient.  Given these adverse findings discussed with patient the recommendation to reduce the dose to 20 mg daily.  She is in agreement to this.  We will continue to monitor for side effects closely.  Given the history of hypertension also recommend at home blood pressure monitoring to ensure that blood pressure is not being affected by stimulant medication.  Patient is in agreement.  We will plan to follow-up in 3 months with ADHD check or sooner if needed.

## 2021-10-26 NOTE — Assessment & Plan Note (Signed)
Chronic.  Well-controlled on current regimen.  No alarm symptoms present today. Recommend continuation of current medication with no changes.  Recent labs have been completed with weight management. We will plan to follow-up in 9 months or sooner as needed.

## 2021-10-27 ENCOUNTER — Ambulatory Visit (HOSPITAL_COMMUNITY)
Admission: RE | Admit: 2021-10-27 | Discharge: 2021-10-27 | Disposition: A | Discharge status: Home / Self Care | Payer: 59 | Source: Ambulatory Visit | Attending: General Surgery | Admitting: General Surgery

## 2021-10-27 ENCOUNTER — Other Ambulatory Visit: Payer: Self-pay

## 2021-10-27 DIAGNOSIS — I10 Essential (primary) hypertension: Secondary | ICD-10-CM | POA: Insufficient documentation

## 2021-12-06 ENCOUNTER — Encounter (INDEPENDENT_AMBULATORY_CARE_PROVIDER_SITE_OTHER): Payer: Self-pay

## 2021-12-14 ENCOUNTER — Encounter: Payer: 59 | Attending: Nurse Practitioner | Admitting: Dietician

## 2021-12-14 ENCOUNTER — Encounter: Payer: Self-pay | Admitting: Dietician

## 2021-12-14 DIAGNOSIS — Z6841 Body Mass Index (BMI) 40.0 and over, adult: Secondary | ICD-10-CM | POA: Insufficient documentation

## 2021-12-14 NOTE — Progress Notes (Signed)
Nutrition Assessment for Bariatric Surgery Medical Nutrition Therapy Appt Start Time: 3:00    End Time: 3:55  Patient was seen on 12/14/2021 for Pre-Operative Nutrition Assessment. Letter of approval faxed to [facility] bariatric surgery program coordinator on 12/15/2021.   Referral stated Supervised Weight Loss (SWL) visits needed: 0  Pt completed visits.   Pt has cleared nutrition requirements.   Planned surgery: Sleeve Gastrectomy Pt expectation of surgery: health improvement, weight more comfortable to move and be active. Pt expectation of dietitian: nothing, just questions when I have them    NUTRITION ASSESSMENT   Anthropometrics  Start weight at [facility]: 293.5 lbs (date: 12/14/2021)  Height: 67 in BMI: 45.97 kg/m2     Clinical  Medical hx: HTN, obesity Medications: Lisinopril/HCT2, Adderall XR Labs: 10/05/2021: iron saturation 14;  Notable signs/symptoms: nothing noted Any previous deficiencies? No  Micronutrient Nutrition Focused Physical Exam: Hair: No issues observed Eyes: No issues observed Mouth: No issues observed Neck: No issues observed Nails: No issues observed Skin: No issues observed  Lifestyle & Dietary Hx  Patient states she lives alone with her dog. The pt performs the food shopping and the pt prepares the meals. She reports that she typically skips or misses 7 out of 21 possible meals per week. She may have 1-2 meals per week that are take-out or at a restaurant.  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 She denies binge eating and denies feeling shame and/or guilt after eating too much food.  She denies having used laxatives or vomiting to facilitate weight loss. She admits to emotional eating during times of sadness or stress. She states that she knows the difference between hunger and thirst and can tell when she is full. Pt states she goes to bed hungry.  Physical Activity: 1-2 days a week  Sleep Hygiene: duration  and quality: 5-7 hours good quality sleep  Current Patient Perceived Stress Level as stated by pt on a scale of 1-10: 6-7      Stress Management Techniques: walk, music, pod-cast, friends, family, isolate  Fruit servings per week: 3-4 Non starchy vegetable servings per week: 7-14 Whole Grains per week: 7-10   24-Hr Dietary Recall First Meal: 4 pm scrambled eggs, protein pancakes or [place] burger Snack:  Second Meal: 9-10pm : grilled chicken salad or protein carb and vegetable Snack: handful of goldfish Third Meal: protein shake, snacks Snack:  Beverages: water, Gatorade zero, coffee, diet coke  Alcoholic beverages per week: 0-1   Estimated Energy Needs Calories: 1500   NUTRITION DIAGNOSIS  Overweight/obesity ([place]-3.3) related to past poor dietary habits and physical inactivity as evidenced by patient w/ planned Sleeve Gastrectomy surgery following dietary guidelines for continued weight loss.    NUTRITION INTERVENTION  Nutrition counseling (C-1) and education (E-2) to facilitate bariatric surgery goals.  Educated pt on micronutrient deficiencies post surgery and strategies to mitigate that risk   Pre-Op Goals Reviewed with the Patient Track food and beverage intake (pen and paper, MyFitness Pal, Baritastic app, etc.) Make healthy food choices while monitoring portion sizes Consume 3 meals per day or try to eat every 3-5 hours Avoid concentrated sugars and fried foods Keep sugar & fat in the single digits per serving on food labels Practice CHEWING your food (aim for applesauce consistency) Practice not drinking 15 minutes before, during, and 30 minutes after each meal and snack Avoid all carbonated beverages (ex: soda, sparkling beverages)  Limit caffeinated beverages (ex: coffee, tea, energy drinks) Avoid all  sugar-sweetened beverages (ex: regular soda, sports drinks)  Avoid alcohol  Aim for 64-100 ounces of FLUID daily (with at least half of fluid intake being plain  water)  Aim for at least 60-80 grams of PROTEIN daily Look for a liquid protein source that contains ?15 g protein and ?5 g carbohydrate (ex: shakes, drinks, shots) Make a list of non-food related activities Physical activity is an important part of a healthy lifestyle so keep it moving! The goal is to reach 150 minutes of exercise per week, including cardiovascular and weight baring activity.  *Goals that are bolded indicate the pt would like to start working towards these  Handouts Provided Include  Bariatric Surgery handouts (Nutrition Visits, Pre-Op Goals, Protein Shakes, Vitamins & Minerals)  Learning Style & Readiness for Change Teaching method utilized: Visual & Auditory  Demonstrated degree of understanding via: Teach Back  Readiness Level: preparation Barriers to learning/adherence to lifestyle change: nothing identified  RD's Notes for Next Visit     MONITORING & EVALUATION Dietary intake, weekly physical activity, body weight, and pre-op goals reached at next nutrition visit.    Next Steps  Pt has completed visits. No further supervised visits required/recomended  Patient is to follow up at [facility] for Pre-Op Class >2 weeks before surgery for further nutrition education.

## 2022-01-02 ENCOUNTER — Other Ambulatory Visit (HOSPITAL_COMMUNITY)
Admission: RE | Admit: 2022-01-02 | Discharge: 2022-01-02 | Disposition: A | Discharge status: Home / Self Care | Payer: 59 | Source: Ambulatory Visit | Attending: Advanced Practice Midwife | Admitting: Advanced Practice Midwife

## 2022-01-02 ENCOUNTER — Encounter (HOSPITAL_BASED_OUTPATIENT_CLINIC_OR_DEPARTMENT_OTHER): Payer: Self-pay | Admitting: Advanced Practice Midwife

## 2022-01-02 ENCOUNTER — Ambulatory Visit (HOSPITAL_BASED_OUTPATIENT_CLINIC_OR_DEPARTMENT_OTHER): Payer: 59 | Admitting: Advanced Practice Midwife

## 2022-01-02 VITALS — BP 121/71 | HR 71 | Ht 67.0 in | Wt 297.0 lb

## 2022-01-02 DIAGNOSIS — Z01419 Encounter for gynecological examination (general) (routine) without abnormal findings: Secondary | ICD-10-CM | POA: Insufficient documentation

## 2022-01-02 DIAGNOSIS — Z124 Encounter for screening for malignant neoplasm of cervix: Secondary | ICD-10-CM | POA: Diagnosis not present

## 2022-01-02 NOTE — Progress Notes (Signed)
   Subjective:     Laura Werner is a 36 y.o. female here at [facility] for a routine exam.  Current complaints: none, regular menses, moderate flow, minimal cramping.  Personal health questionnaire reviewed: yes.  Do you have a primary care provider? yes Do you feel safe at home? yes  [place] Visit from 01/02/2022 in [facility]  PHQ-2 Total Score 0       Health Maintenance Due  Topic Date Due   Hepatitis C Screening  Never done   COVID-19 Vaccine (4 - Pfizer series) 04/13/2020   TETANUS/TDAP  08/14/2020   PAP SMEAR-Modifier  10/29/2020   INFLUENZA VACCINE  11/28/2021     Risk factors for chronic health problems: Smoking: never Alchohol/how much: 1-2 drinks/week or less Pt BMI: Body mass index is 46.52 kg/m.   Gynecologic History Patient's last menstrual period was 12/19/2021 (approximate). Contraception: abstinence Last Pap: 2019. Results were: normal, neg HPV Last mammogram: n/a.   Obstetric History OB History  No obstetric history on file.     The following portions of the patient's history were reviewed and updated as appropriate: allergies, current medications, past family history, past medical history, past social history, past surgical history, and problem list.  Review of Systems Pertinent items noted in HPI and remainder of comprehensive ROS otherwise negative.    Objective:   BP 121/71 (BP Location: Right Arm, Patient Position: Sitting, Cuff Size: Large)   Pulse 71   Ht '5\' 7"'$  (1.702 m) Comment: Reported  Wt 297 lb (134.7 kg)   LMP 12/19/2021 (Approximate)   BMI 46.52 kg/m  VS reviewed, nursing note reviewed,  Constitutional: well developed, well nourished, no distress HEENT: normocephalic CV: normal rate Pulm/chest wall: normal effort Breast Exam:  exam performed: right breast normal without mass, skin or nipple changes or axillary nodes, left breast normal without mass, skin or nipple changes or axillary  nodes Abdomen: soft Neuro: alert and oriented x 3 Skin: warm, dry Psych: affect normal Pelvic exam: Performed: Cervix pink, visually closed, without lesion, scant white creamy discharge, vaginal walls and external genitalia normal Bimanual exam: Cervix 0/long/high, firm, anterior, neg CMT, uterus nontender, nonenlarged, adnexa without tenderness, enlargement, or mass      Assessment/Plan:   1. Encounter for gynecological examination with Papanicolaou smear of cervix  - Cytology - PAP( [place])  2. Cervical cancer screening  - Cytology - PAP( [place])  3. Well woman exam with routine gynecological exam --No gyn concerns or problems --Pt planning gastric sleeve surgery this year, discussed expected outcomes/improvements in health  Return in about 1 year (around 01/03/2023) for annual exam.   Fatima Blank, CNM 2:22 PM

## 2022-01-04 LAB — CYTOLOGY - PAP
Comment: NEGATIVE
Diagnosis: NEGATIVE
High risk HPV: NEGATIVE

## 2022-01-08 ENCOUNTER — Ambulatory Visit: Payer: 59

## 2022-01-11 NOTE — Progress Notes (Signed)
Sent message, via epic in basket, requesting order in epic from surgeon     01/11/22 1016  Preop Orders  Has preop orders? No  Name of staff/physician contacted for orders(Indicate phone or IB message) L. Kinsinger, MD.

## 2022-01-12 NOTE — Patient Instructions (Signed)
SURGICAL WAITING ROOM VISITATION Patients having surgery or a procedure may have no more than 2 support people in the waiting area - these visitors may rotate.   Children under the age of 37 must have an adult with them who is not the patient. If the patient needs to stay at the hospital during part of their recovery, the visitor guidelines for inpatient rooms apply. Pre-op nurse will coordinate an appropriate time for 1 support person to accompany patient in pre-op.  This support person may not rotate.    Please refer to the [facility] website for the visitor guidelines for Inpatients (after your surgery is over and you are in a regular room).      Your procedure is scheduled on: 01-29-22   Report to [facility] Main Entrance    Report to admitting at 5:15 AM   Call this number if you have problems the morning of surgery [phone]   Do not eat food :After 6:00 PM.   After 6:00 PM you may have the following liquids until 4:30 AM DAY OF SURGERY  Water Non-Citrus Juices (without pulp, NO RED) Carbonated Beverages Black Coffee (NO MILK/CREAM OR CREAMERS, sugar ok)  Clear Tea (NO MILK/CREAM OR CREAMERS, sugar ok) regular and decaf                             Plain Jell-O (NO RED)                                           Fruit ices (not with fruit pulp, NO RED)                                     Popsicles (NO RED)                                                               Sports drinks like Gatorade (NO RED)                   The day of surgery:  Drink ONE (1) Pre-Surgery G2 at 4:30 AM the morning of surgery. Drink in one sitting. Do not sip.  This drink was given to you during your hospital  pre-op appointment visit. Nothing else to drink after completing the Pre-Surgery G2.          If you have questions, please contact your surgeon's office.   FOLLOW ANY ADDITIONAL PRE OP INSTRUCTIONS YOU RECEIVED FROM YOUR SURGEON'S OFFICE!!!     Oral Hygiene is also  important to reduce your risk of infection.                                    Remember - BRUSH YOUR TEETH THE MORNING OF SURGERY WITH YOUR REGULAR TOOTHPASTE   Do NOT smoke after Midnight   Take these medicines the morning of surgery with A SIP OF WATER: None  DO NOT TAKE ANY ORAL DIABETIC MEDICATIONS DAY OF YOUR SURGERY  Bring CPAP mask and tubing day of surgery.                              You may not have any metal on your body including hair pins, jewelry, and body piercing             Do not wear make-up, lotions, powders, perfumes or deodorant  Do not wear nail polish including gel and S&S, artificial/acrylic nails, or any other type of covering on natural nails including finger and toenails. If you have artificial nails, gel coating, etc. that needs to be removed by a nail salon please have this removed prior to surgery or surgery may need to be canceled/ delayed if the surgeon/ anesthesia feels like they are unable to be safely monitored.   Do not shave  48 hours prior to surgery.      Do not bring valuables to the hospital. [place].   Contacts, dentures or bridgework may not be worn into surgery.   Bring small overnight bag day of surgery.   DO NOT [place]. PHARMACY WILL DISPENSE MEDICATIONS LISTED ON YOUR MEDICATION LIST TO YOU DURING YOUR ADMISSION [place]!   Special Instructions: Bring a copy of your healthcare power of attorney and living will documents the day of surgery if you haven't scanned them before.   Please read over the following fact sheets you were given: IF YOU HAVE QUESTIONS ABOUT YOUR PRE-OP INSTRUCTIONS PLEASE CALL [place] - Preparing for Surgery Before surgery, you can play an important role.  Because skin is not sterile, your skin needs to be as free of germs as possible.  You can reduce the number of germs on your skin by washing with CHG  (chlorahexidine gluconate) soap before surgery.  CHG is an antiseptic cleaner which kills germs and bonds with the skin to continue killing germs even after washing. Please DO NOT use if you have an allergy to CHG or antibacterial soaps.  If your skin becomes reddened/irritated stop using the CHG and inform your nurse when you arrive at Short Stay. Do not shave (including legs and underarms) for at least 48 hours prior to the first CHG shower.  You may shave your face/neck.  Please follow these instructions carefully:  1.  Shower with CHG Soap the night before surgery and the  morning of surgery.  2.  If you choose to wash your hair, wash your hair first as usual with your normal  shampoo.  3.  After you shampoo, rinse your hair and body thoroughly to remove the shampoo.                             4.  Use CHG as you would any other liquid soap.  You can apply chg directly to the skin and wash.  Gently with a scrungie or clean washcloth.  5.  Apply the CHG Soap to your body ONLY FROM THE NECK DOWN.   Do   not use on face/ open                           Wound or open sores. Avoid contact with eyes, ears mouth and   genitals (private parts).  Wash face,  Genitals (private parts) with your normal soap.             6.  Wash thoroughly, paying special attention to the area where your    surgery  will be performed.  7.  Thoroughly rinse your body with warm water from the neck down.  8.  DO NOT shower/wash with your normal soap after using and rinsing off the CHG Soap.                9.  Pat yourself dry with a clean towel.            10.  Wear clean pajamas.            11.  Place clean sheets on your bed the night of your first shower and do not  sleep with pets. Day of Surgery : Do not apply any lotions/deodorants the morning of surgery.  Please wear clean clothes to the hospital/surgery center.  FAILURE TO FOLLOW THESE INSTRUCTIONS MAY RESULT IN THE CANCELLATION OF YOUR  SURGERY  PATIENT SIGNATURE_________________________________  NURSE SIGNATURE__________________________________  ________________________________________________________________________     Adam Phenix  An incentive spirometer is a tool that can help keep your lungs clear and active. This tool measures how well you are filling your lungs with each breath. Taking long deep breaths may help reverse or decrease the chance of developing breathing (pulmonary) problems (especially infection) following: A long period of time when you are unable to move or be active. BEFORE THE PROCEDURE  If the spirometer includes an indicator to show your best effort, your nurse or respiratory therapist will set it to a desired goal. If possible, sit up straight or lean slightly forward. Try not to slouch. Hold the incentive spirometer in an upright position. INSTRUCTIONS FOR USE  Sit on the edge of your bed if possible, or sit up as far as you can in bed or on a chair. Hold the incentive spirometer in an upright position. Breathe out normally. Place the mouthpiece in your mouth and seal your lips tightly around it. Breathe in slowly and as deeply as possible, raising the piston or the ball toward the top of the column. Hold your breath for 3-5 seconds or for as long as possible. Allow the piston or ball to fall to the bottom of the column. Remove the mouthpiece from your mouth and breathe out normally. Rest for a few seconds and repeat Steps 1 through 7 at least 10 times every 1-2 hours when you are awake. Take your time and take a few normal breaths between deep breaths. The spirometer may include an indicator to show your best effort. Use the indicator as a goal to work toward during each repetition. After each set of 10 deep breaths, practice coughing to be sure your lungs are clear. If you have an incision (the cut made at the time of surgery), support your incision when coughing by placing a pillow  or rolled up towels firmly against it. Once you are able to get out of bed, walk around indoors and cough well. You may stop using the incentive spirometer when instructed by your caregiver.  RISKS AND COMPLICATIONS Take your time so you do not get dizzy or light-headed. If you are in pain, you may need to take or ask for pain medication before doing incentive spirometry. It is harder to take a deep breath if you are having pain. AFTER USE Rest and breathe slowly and easily. It can  be helpful to keep track of a log of your progress. Your caregiver can provide you with a simple table to help with this. If you are using the spirometer at home, follow these instructions: [place] IF:  You are having difficultly using the spirometer. You have trouble using the spirometer as often as instructed. Your pain medication is not giving enough relief while using the spirometer. You develop fever of 100.5 F (38.1 C) or higher. SEEK IMMEDIATE MEDICAL CARE IF:  You cough up bloody sputum that had not been present before. You develop fever of 102 F (38.9 C) or greater. You develop worsening pain at or near the incision site. MAKE SURE YOU:  Understand these instructions. Will watch your condition. Will get help right away if you are not doing well or get worse. Document Released: 08/27/2006 Document Revised: 07/09/2011 Document Reviewed: 10/28/2006 ExitCare Patient Information 2014 Exit[employer], [place].   ________________________________________________________________________  WHAT IS A BLOOD TRANSFUSION? Blood Transfusion Information  A transfusion is the replacement of blood or some of its parts. Blood is made up of multiple cells which provide different functions. Red blood cells carry oxygen and are used for blood loss replacement. White blood cells fight against infection. Platelets control bleeding. Plasma helps clot blood. Other blood products are available for specialized needs,  such as hemophilia or other clotting disorders. BEFORE THE TRANSFUSION  Who gives blood for transfusions?  Healthy volunteers who are fully evaluated to make sure their blood is safe. This is blood bank blood. Transfusion therapy is the safest it has ever been in the practice of medicine. Before blood is taken from a donor, a complete history is taken to make sure that person has no history of diseases nor engages in risky social behavior (examples are intravenous drug use or sexual activity with multiple partners). The donor's travel history is screened to minimize risk of transmitting infections, such as malaria. The donated blood is tested for signs of infectious diseases, such as HIV and hepatitis. The blood is then tested to be sure it is compatible with you in order to minimize the chance of a transfusion reaction. If you or a relative donates blood, this is often done in anticipation of surgery and is not appropriate for emergency situations. It takes many days to process the donated blood. RISKS AND COMPLICATIONS Although transfusion therapy is very safe and saves many lives, the main dangers of transfusion include:  Getting an infectious disease. Developing a transfusion reaction. This is an allergic reaction to something in the blood you were given. Every precaution is taken to prevent this. The decision to have a blood transfusion has been considered carefully by your caregiver before blood is given. Blood is not given unless the benefits outweigh the risks. AFTER THE TRANSFUSION Right after receiving a blood transfusion, you will usually feel much better and more energetic. This is especially true if your red blood cells have gotten low (anemic). The transfusion raises the level of the red blood cells which carry oxygen, and this usually causes an energy increase. The nurse administering the transfusion will monitor you carefully for complications. HOME CARE INSTRUCTIONS  No special  instructions are needed after a transfusion. You may find your energy is better. Speak with your caregiver about any limitations on activity for underlying diseases you may have. SEEK MEDICAL CARE IF:  Your condition is not improving after your transfusion. You develop redness or irritation at the intravenous (IV) site. SEEK IMMEDIATE MEDICAL CARE IF:  Any of the following symptoms occur over the next 12 hours: Shaking chills. You have a temperature by mouth above 102 F (38.9 C), not controlled by medicine. Chest, back, or muscle pain. People around you feel you are not acting correctly or are confused. Shortness of breath or difficulty breathing. Dizziness and fainting. You get a rash or develop hives. You have a decrease in urine output. Your urine turns a dark color or changes to pink, red, or brown. Any of the following symptoms occur over the next 10 days: You have a temperature by mouth above 102 F (38.9 C), not controlled by medicine. Shortness of breath. Weakness after normal activity. The white part of the eye turns yellow (jaundice). You have a decrease in the amount of urine or are urinating less often. Your urine turns a dark color or changes to pink, red, or brown. Document Released: 04/13/2000 Document Revised: 07/09/2011 Document Reviewed: 12/01/2007 [facility] Patient Information 2014 [place], [place].  _______________________________________________________________________

## 2022-01-15 ENCOUNTER — Telehealth (HOSPITAL_BASED_OUTPATIENT_CLINIC_OR_DEPARTMENT_OTHER): Payer: 59 | Admitting: Nurse Practitioner

## 2022-01-15 ENCOUNTER — Encounter: Payer: 59 | Attending: Nurse Practitioner | Admitting: Skilled Nursing Facility1

## 2022-01-15 ENCOUNTER — Encounter: Payer: Self-pay | Admitting: Skilled Nursing Facility1

## 2022-01-15 ENCOUNTER — Ambulatory Visit: Payer: Self-pay | Admitting: General Surgery

## 2022-01-15 DIAGNOSIS — Z6841 Body Mass Index (BMI) 40.0 and over, adult: Secondary | ICD-10-CM | POA: Diagnosis present

## 2022-01-15 NOTE — Progress Notes (Signed)
Pre-Operative Nutrition Class:    Patient was seen on 01/15/2022 for Pre-Operative Bariatric Surgery Education at [facility]    Surgery date: 01/29/2022 Surgery type: Sleeve Start weight at [facility]: 293.5 Weight today: 295.8  Samples given per MNT protocol. Patient educated on appropriate usage: Bariatric Advantage Multivitamin Lot # [id number] Exp: 08/24   Bariatric Advantage Calcium  Lot # [id number] Exp: 08/09/2022   Protein Shake Lot # [id number] / [id number] Exp: 28 Jan 2022 /  30 Apr 2022    The following the learning objectives were met by the patient during this course: Identify Pre-Op Dietary Goals and will begin 2 weeks pre-operatively Identify appropriate sources of fluids and proteins  State protein recommendations and appropriate sources pre and post-operatively Identify Post-Operative Dietary Goals and will follow for 2 weeks post-operatively Identify appropriate multivitamin and calcium sources Describe the need for physical activity post-operatively and will follow MD recommendations State when to call healthcare provider regarding medication questions or post-operative complications When having a diagnosis of diabetes understanding hypoglycemia symptoms and the inclusion of 1 complex carbohydrate per meal  Handouts given during class include: Pre-Op Bariatric Surgery Diet Handout Protein Shake Handout Post-Op Bariatric Surgery Nutrition Handout [facility] Program Information Flyer Support Group Information Flyer [facility] Bariatric Supplements Price List  Follow-Up Plan: Patient will follow-up at [facility] 2 weeks post operatively for diet advancement per MD.

## 2022-01-17 NOTE — Progress Notes (Signed)
COVID Vaccine Completed:  Yes  Date of COVID positive in last 90 days:  PCP - Jacolyn Reedy, NP Cardiologist -   Chest x-ray - 10-27-21 Epic EKG - 10-27-21 Epic Stress Test -  ECHO -  Cardiac Cath -  Pacemaker/ICD device last checked: Spinal Cord Stimulator:  Bowel Prep -   Sleep Study -  CPAP -   Fasting Blood Sugar -  Checks Blood Sugar _____ times a day  Blood Thinner Instructions: Aspirin Instructions: Last Dose:  Activity level:  Can go up a flight of stairs and perform activities of daily living without stopping and without symptoms of chest pain or shortness of breath.  Able to exercise without symptoms  Unable to go up a flight of stairs without symptoms of     Anesthesia review:   Patient denies shortness of breath, fever, cough and chest pain at PAT appointment  Patient verbalized understanding of instructions that were given to them at the PAT appointment. Patient was also instructed that they will need to review over the PAT instructions again at home before surgery.

## 2022-01-18 ENCOUNTER — Other Ambulatory Visit: Payer: Self-pay

## 2022-01-18 ENCOUNTER — Encounter (HOSPITAL_COMMUNITY): Payer: Self-pay

## 2022-01-18 ENCOUNTER — Encounter (HOSPITAL_COMMUNITY)
Admission: RE | Admit: 2022-01-18 | Discharge: 2022-01-18 | Disposition: A | Discharge status: Home / Self Care | Payer: 59 | Source: Ambulatory Visit | Attending: General Surgery | Admitting: General Surgery

## 2022-01-18 DIAGNOSIS — Z01812 Encounter for preprocedural laboratory examination: Secondary | ICD-10-CM | POA: Diagnosis present

## 2022-01-18 DIAGNOSIS — Z6841 Body Mass Index (BMI) 40.0 and over, adult: Secondary | ICD-10-CM | POA: Diagnosis not present

## 2022-01-18 LAB — CBC WITH DIFFERENTIAL/PLATELET
Abs Immature Granulocytes: 0.03 10*3/uL (ref 0.00–0.07)
Basophils Absolute: 0 10*3/uL (ref 0.0–0.1)
Basophils Relative: 0 %
Eosinophils Absolute: 0.1 10*3/uL (ref 0.0–0.5)
Eosinophils Relative: 1 %
HCT: 39.9 % (ref 36.0–46.0)
Hemoglobin: 13.3 g/dL (ref 12.0–15.0)
Immature Granulocytes: 0 %
Lymphocytes Relative: 29 %
Lymphs Abs: 2 10*3/uL (ref 0.7–4.0)
MCH: 27.3 pg (ref 26.0–34.0)
MCHC: 33.3 g/dL (ref 30.0–36.0)
MCV: 81.9 fL (ref 80.0–100.0)
Monocytes Absolute: 0.5 10*3/uL (ref 0.1–1.0)
Monocytes Relative: 7 %
Neutro Abs: 4.2 10*3/uL (ref 1.7–7.7)
Neutrophils Relative %: 63 %
Platelets: 232 10*3/uL (ref 150–400)
RBC: 4.87 MIL/uL (ref 3.87–5.11)
RDW: 12.9 % (ref 11.5–15.5)
WBC: 6.7 10*3/uL (ref 4.0–10.5)
nRBC: 0 % (ref 0.0–0.2)

## 2022-01-18 LAB — TYPE AND SCREEN
ABO/RH(D): B NEG
Antibody Screen: NEGATIVE

## 2022-01-18 LAB — COMPREHENSIVE METABOLIC PANEL
ALT: 26 U/L (ref 0–44)
AST: 20 U/L (ref 15–41)
Albumin: 3.7 g/dL (ref 3.5–5.0)
Alkaline Phosphatase: 79 U/L (ref 38–126)
Anion gap: 7 (ref 5–15)
BUN: 19 mg/dL (ref 6–20)
CO2: 27 mmol/L (ref 22–32)
Calcium: 9 mg/dL (ref 8.9–10.3)
Chloride: 103 mmol/L (ref 98–111)
Creatinine, Ser: 0.65 mg/dL (ref 0.44–1.00)
GFR, Estimated: >60 mL/min (ref >60)
Glucose, Bld: 114 mg/dL — ABNORMAL HIGH (ref 70–99)
Potassium: 3.4 mmol/L — ABNORMAL LOW (ref 3.5–5.1)
Sodium: 137 mmol/L (ref 135–145)
Total Bilirubin: 0.6 mg/dL (ref 0.3–1.2)
Total Protein: 6.9 g/dL (ref 6.5–8.1)

## 2022-01-27 NOTE — Anesthesia Preprocedure Evaluation (Addendum)
Anesthesia Evaluation  Patient identified by MRN, date of birth, ID band Patient awake    Reviewed: Allergy & Precautions, H&P , NPO status , Patient's Chart, lab work & pertinent test results  Airway Mallampati: I  TM Distance: >3 FB Neck ROM: Full    Dental no notable dental hx. (+) Teeth Intact, Dental Advisory Given   Pulmonary neg pulmonary ROS,    Pulmonary exam normal breath sounds clear to auscultation       Cardiovascular Exercise Tolerance: Good hypertension, Pt. on medications negative cardio ROS Normal cardiovascular exam Rhythm:Regular Rate:Normal     Neuro/Psych PSYCHIATRIC DISORDERS negative neurological ROS  negative psych ROS   GI/Hepatic negative GI ROS, Neg liver ROS,   Endo/Other  negative endocrine ROSMorbid obesity  Renal/GU negative Renal ROS  negative genitourinary   Musculoskeletal negative musculoskeletal ROS (+)   Abdominal (+) + obese,   Peds negative pediatric ROS (+) ADHD Hematology negative hematology ROS (+)   Anesthesia Other Findings   Reproductive/Obstetrics negative OB ROS                            Anesthesia Physical Anesthesia Plan  ASA: 3  Anesthesia Plan: General   Post-op Pain Management: Ofirmev IV (intra-op)* and Toradol IV (intra-op)*   Induction: Intravenous  PONV Risk Score and Plan: 3 and Ondansetron and Dexamethasone  Airway Management Planned: Oral ETT  Additional Equipment: None  Intra-op Plan:   Post-operative Plan: Extubation in OR  Informed Consent: I have reviewed the patients History and Physical, chart, labs and discussed the procedure including the risks, benefits and alternatives for the proposed anesthesia with the patient or authorized representative who has indicated his/her understanding and acceptance.       Plan Discussed with: Anesthesiologist and CRNA  Anesthesia Plan Comments: (  )       Anesthesia Quick Evaluation

## 2022-01-29 ENCOUNTER — Other Ambulatory Visit: Payer: Self-pay

## 2022-01-29 ENCOUNTER — Encounter (HOSPITAL_COMMUNITY): Payer: Self-pay | Admitting: General Surgery

## 2022-01-29 ENCOUNTER — Observation Stay (HOSPITAL_COMMUNITY)
Admission: RE | Admit: 2022-01-29 | Discharge: 2022-01-30 | Disposition: A | Discharge status: Home / Self Care | Payer: 59 | Source: Ambulatory Visit | Attending: General Surgery | Admitting: General Surgery

## 2022-01-29 ENCOUNTER — Encounter (HOSPITAL_COMMUNITY)
Admission: RE | Disposition: A | Discharge status: Home / Self Care | Payer: Self-pay | Source: Ambulatory Visit | Attending: General Surgery

## 2022-01-29 ENCOUNTER — Ambulatory Visit (HOSPITAL_COMMUNITY): Payer: 59 | Admitting: Anesthesiology

## 2022-01-29 ENCOUNTER — Ambulatory Visit (HOSPITAL_BASED_OUTPATIENT_CLINIC_OR_DEPARTMENT_OTHER): Payer: 59 | Admitting: Anesthesiology

## 2022-01-29 DIAGNOSIS — Z6841 Body Mass Index (BMI) 40.0 and over, adult: Secondary | ICD-10-CM | POA: Diagnosis not present

## 2022-01-29 DIAGNOSIS — K219 Gastro-esophageal reflux disease without esophagitis: Secondary | ICD-10-CM | POA: Diagnosis not present

## 2022-01-29 DIAGNOSIS — I10 Essential (primary) hypertension: Secondary | ICD-10-CM | POA: Insufficient documentation

## 2022-01-29 DIAGNOSIS — F909 Attention-deficit hyperactivity disorder, unspecified type: Secondary | ICD-10-CM

## 2022-01-29 DIAGNOSIS — Z79899 Other long term (current) drug therapy: Secondary | ICD-10-CM | POA: Diagnosis not present

## 2022-01-29 HISTORY — DX: Morbid (severe) obesity due to excess calories: E66.01

## 2022-01-29 HISTORY — PX: UPPER GI ENDOSCOPY: SHX6162

## 2022-01-29 HISTORY — PX: LAPAROSCOPIC GASTRIC SLEEVE RESECTION: SHX5895

## 2022-01-29 LAB — HEMOGLOBIN AND HEMATOCRIT, BLOOD
HCT: 40.2 % (ref 36.0–46.0)
Hemoglobin: 12.8 g/dL (ref 12.0–15.0)

## 2022-01-29 LAB — CBC
HCT: 39.3 % (ref 36.0–46.0)
Hemoglobin: 12.8 g/dL (ref 12.0–15.0)
MCH: 27.1 pg (ref 26.0–34.0)
MCHC: 32.6 g/dL (ref 30.0–36.0)
MCV: 83.3 fL (ref 80.0–100.0)
Platelets: 159 10*3/uL (ref 150–400)
RBC: 4.72 MIL/uL (ref 3.87–5.11)
RDW: 13 % (ref 11.5–15.5)
WBC: 12.3 10*3/uL — ABNORMAL HIGH (ref 4.0–10.5)
nRBC: 0 % (ref 0.0–0.2)

## 2022-01-29 LAB — CREATININE, SERUM
Creatinine, Ser: 0.79 mg/dL (ref 0.44–1.00)
GFR, Estimated: >60 mL/min (ref >60)

## 2022-01-29 LAB — POCT PREGNANCY, URINE: Preg Test, Ur: NEGATIVE

## 2022-01-29 LAB — ABO/RH: ABO/RH(D): B NEG

## 2022-01-29 SURGERY — GASTRECTOMY, SLEEVE, LAPAROSCOPIC
Anesthesia: General | Site: Abdomen

## 2022-01-29 MED ORDER — BUPIVACAINE LIPOSOME 1.3 % IJ SUSP
INTRAMUSCULAR | Status: AC
  Filled 2022-01-29: qty 20

## 2022-01-29 MED ORDER — ONDANSETRON HCL 4 MG/2ML IJ SOLN: 4.0000 mg | Freq: Once | INTRAMUSCULAR | Status: DC | PRN

## 2022-01-29 MED ORDER — BUPIVACAINE LIPOSOME 1.3 % IJ SUSP
INTRAMUSCULAR | Status: DC | PRN
  Administered 2022-01-29: 20 mL

## 2022-01-29 MED ORDER — ONDANSETRON HCL 4 MG/2ML IJ SOLN
INTRAMUSCULAR | Status: AC
  Filled 2022-01-29: qty 2

## 2022-01-29 MED ORDER — CHLORHEXIDINE GLUCONATE CLOTH 2 % EX PADS: 6.0000 | MEDICATED_PAD | Freq: Once | CUTANEOUS | Status: DC

## 2022-01-29 MED ORDER — SODIUM CHLORIDE 0.9 % IV SOLN
2.0000 g | INTRAVENOUS | Status: AC
  Administered 2022-01-29: 2 g via INTRAVENOUS
  Filled 2022-01-29: qty 2

## 2022-01-29 MED ORDER — OXYCODONE HCL 5 MG/5ML PO SOLN
5.0000 mg | Freq: Once | ORAL | Status: AC | PRN
  Administered 2022-01-29: 5 mg via ORAL

## 2022-01-29 MED ORDER — SCOPOLAMINE 1 MG/3DAYS TD PT72
1.0000 | MEDICATED_PATCH | TRANSDERMAL | Status: DC
  Administered 2022-01-29: 1.5 mg via TRANSDERMAL
  Filled 2022-01-29: qty 1

## 2022-01-29 MED ORDER — DEXAMETHASONE SODIUM PHOSPHATE 10 MG/ML IJ SOLN
INTRAMUSCULAR | Status: DC | PRN
  Administered 2022-01-29: 10 mg via INTRAVENOUS

## 2022-01-29 MED ORDER — ORAL CARE MOUTH RINSE: 15.0000 mL | Freq: Once | OROMUCOSAL | Status: AC

## 2022-01-29 MED ORDER — KETAMINE HCL 10 MG/ML IJ SOLN
INTRAMUSCULAR | Status: DC | PRN
  Administered 2022-01-29: 30 mg via INTRAVENOUS

## 2022-01-29 MED ORDER — MEPERIDINE HCL 50 MG/ML IJ SOLN: 6.2500 mg | INTRAMUSCULAR | Status: DC | PRN

## 2022-01-29 MED ORDER — PROPOFOL 10 MG/ML IV BOLUS
INTRAVENOUS | Status: AC
  Filled 2022-01-29: qty 20

## 2022-01-29 MED ORDER — PHENYLEPHRINE 80 MCG/ML (10ML) SYRINGE FOR IV PUSH (FOR BLOOD PRESSURE SUPPORT)
PREFILLED_SYRINGE | INTRAVENOUS | Status: DC | PRN
  Administered 2022-01-29: 80 ug via INTRAVENOUS
  Administered 2022-01-29: 160 ug via INTRAVENOUS
  Administered 2022-01-29: 240 ug via INTRAVENOUS
  Administered 2022-01-29: 160 ug via INTRAVENOUS
  Administered 2022-01-29: 80 ug via INTRAVENOUS

## 2022-01-29 MED ORDER — ACETAMINOPHEN 160 MG/5ML PO SOLN: 325.0000 mg | ORAL | Status: DC | PRN

## 2022-01-29 MED ORDER — FAMOTIDINE IN NACL 20-0.9 MG/50ML-% IV SOLN
20.0000 mg | Freq: Two times a day (BID) | INTRAVENOUS | Status: DC
  Administered 2022-01-29 – 2022-01-30 (×2): 20 mg via INTRAVENOUS
  Filled 2022-01-29 (×2): qty 50

## 2022-01-29 MED ORDER — HEPARIN SODIUM (PORCINE) 5000 UNIT/ML IJ SOLN
5000.0000 [IU] | Freq: Three times a day (TID) | INTRAMUSCULAR | Status: DC
  Administered 2022-01-29 – 2022-01-30 (×2): 5000 [IU] via SUBCUTANEOUS
  Filled 2022-01-29 (×2): qty 1

## 2022-01-29 MED ORDER — ACETAMINOPHEN 500 MG PO TABS
1000.0000 mg | ORAL_TABLET | Freq: Three times a day (TID) | ORAL | Status: DC
  Administered 2022-01-29 – 2022-01-30 (×3): 1000 mg via ORAL
  Filled 2022-01-29 (×3): qty 2

## 2022-01-29 MED ORDER — DEXTROSE-NACL 5-0.45 % IV SOLN: INTRAVENOUS | Status: DC

## 2022-01-29 MED ORDER — BUPIVACAINE LIPOSOME 1.3 % IJ SUSP: 20.0000 mL | Freq: Once | INTRAMUSCULAR | Status: DC

## 2022-01-29 MED ORDER — ACETAMINOPHEN 325 MG PO TABS: 325.0000 mg | ORAL_TABLET | ORAL | Status: DC | PRN

## 2022-01-29 MED ORDER — ROCURONIUM BROMIDE 10 MG/ML (PF) SYRINGE
PREFILLED_SYRINGE | INTRAVENOUS | Status: DC | PRN
  Administered 2022-01-29: 80 mg via INTRAVENOUS

## 2022-01-29 MED ORDER — HEPARIN SODIUM (PORCINE) 5000 UNIT/ML IJ SOLN: 5000.0000 [IU] | Freq: Three times a day (TID) | INTRAMUSCULAR | Status: DC

## 2022-01-29 MED ORDER — CHLORHEXIDINE GLUCONATE 0.12 % MT SOLN
15.0000 mL | Freq: Once | OROMUCOSAL | Status: AC
  Administered 2022-01-29: 15 mL via OROMUCOSAL

## 2022-01-29 MED ORDER — HYDRALAZINE HCL 20 MG/ML IJ SOLN: 10.0000 mg | INTRAMUSCULAR | Status: DC | PRN

## 2022-01-29 MED ORDER — MIDAZOLAM HCL 2 MG/2ML IJ SOLN
INTRAMUSCULAR | Status: AC
  Filled 2022-01-29: qty 2

## 2022-01-29 MED ORDER — HEPARIN SODIUM (PORCINE) 5000 UNIT/ML IJ SOLN
5000.0000 [IU] | INTRAMUSCULAR | Status: AC
  Administered 2022-01-29: 5000 [IU] via SUBCUTANEOUS
  Filled 2022-01-29: qty 1

## 2022-01-29 MED ORDER — DEXAMETHASONE SODIUM PHOSPHATE 4 MG/ML IJ SOLN: 4.0000 mg | INTRAMUSCULAR | Status: DC

## 2022-01-29 MED ORDER — OXYCODONE HCL 5 MG/5ML PO SOLN
ORAL | Status: AC
  Administered 2022-01-30: 5 mg via ORAL
  Filled 2022-01-29: qty 5

## 2022-01-29 MED ORDER — ACETAMINOPHEN 160 MG/5ML PO SOLN: 1000.0000 mg | Freq: Three times a day (TID) | ORAL | Status: DC

## 2022-01-29 MED ORDER — ONDANSETRON HCL 4 MG/2ML IJ SOLN: 4.0000 mg | INTRAMUSCULAR | Status: DC | PRN

## 2022-01-29 MED ORDER — LACTATED RINGERS IV SOLN: INTRAVENOUS | Status: DC

## 2022-01-29 MED ORDER — APREPITANT 40 MG PO CAPS
40.0000 mg | ORAL_CAPSULE | ORAL | Status: AC
  Administered 2022-01-29: 40 mg via ORAL
  Filled 2022-01-29: qty 1

## 2022-01-29 MED ORDER — BUPIVACAINE-EPINEPHRINE (PF) 0.25% -1:200000 IJ SOLN
INTRAMUSCULAR | Status: AC
  Filled 2022-01-29: qty 30

## 2022-01-29 MED ORDER — FENTANYL CITRATE (PF) 250 MCG/5ML IJ SOLN
INTRAMUSCULAR | Status: AC
  Filled 2022-01-29: qty 5

## 2022-01-29 MED ORDER — 0.9 % SODIUM CHLORIDE (POUR BTL) OPTIME
TOPICAL | Status: DC | PRN
  Administered 2022-01-29: 1000 mL

## 2022-01-29 MED ORDER — LACTATED RINGERS IR SOLN
Status: DC | PRN
  Administered 2022-01-29: 1000 mL

## 2022-01-29 MED ORDER — OXYCODONE HCL 5 MG PO TABS: 5.0000 mg | ORAL_TABLET | Freq: Once | ORAL | Status: AC | PRN

## 2022-01-29 MED ORDER — PROPOFOL 10 MG/ML IV BOLUS
INTRAVENOUS | Status: DC | PRN
  Administered 2022-01-29: 200 mg via INTRAVENOUS

## 2022-01-29 MED ORDER — LEVOCETIRIZINE DIHYDROCHLORIDE 5 MG PO TABS: 5.0000 mg | ORAL_TABLET | Freq: Every evening | ORAL | Status: DC | PRN

## 2022-01-29 MED ORDER — OXYCODONE HCL 5 MG/5ML PO SOLN
5.0000 mg | Freq: Four times a day (QID) | ORAL | Status: DC | PRN
  Administered 2022-01-29 – 2022-01-30 (×2): 5 mg via ORAL
  Filled 2022-01-29 (×3): qty 5

## 2022-01-29 MED ORDER — FENTANYL CITRATE (PF) 100 MCG/2ML IJ SOLN
INTRAMUSCULAR | Status: DC | PRN
  Administered 2022-01-29: 100 ug via INTRAVENOUS

## 2022-01-29 MED ORDER — FENTANYL CITRATE PF 50 MCG/ML IJ SOSY
PREFILLED_SYRINGE | INTRAMUSCULAR | Status: AC
  Filled 2022-01-29: qty 2

## 2022-01-29 MED ORDER — FENTANYL CITRATE PF 50 MCG/ML IJ SOSY
25.0000 ug | PREFILLED_SYRINGE | INTRAMUSCULAR | Status: DC | PRN
  Administered 2022-01-29 (×2): 50 ug via INTRAVENOUS

## 2022-01-29 MED ORDER — ACETAMINOPHEN 500 MG PO TABS
1000.0000 mg | ORAL_TABLET | ORAL | Status: AC
  Administered 2022-01-29: 1000 mg via ORAL
  Filled 2022-01-29: qty 2

## 2022-01-29 MED ORDER — PHENYLEPHRINE 80 MCG/ML (10ML) SYRINGE FOR IV PUSH (FOR BLOOD PRESSURE SUPPORT)
PREFILLED_SYRINGE | INTRAVENOUS | Status: AC
  Filled 2022-01-29: qty 10

## 2022-01-29 MED ORDER — ONDANSETRON HCL 4 MG/2ML IJ SOLN
INTRAMUSCULAR | Status: DC | PRN
  Administered 2022-01-29: 4 mg via INTRAVENOUS

## 2022-01-29 MED ORDER — SUGAMMADEX SODIUM 200 MG/2ML IV SOLN
INTRAVENOUS | Status: DC | PRN
  Administered 2022-01-29: 300 mg via INTRAVENOUS

## 2022-01-29 MED ORDER — ENSURE MAX PROTEIN PO LIQD
2.0000 [oz_av] | ORAL | Status: DC
  Administered 2022-01-30 (×5): 2 [oz_av] via ORAL

## 2022-01-29 MED ORDER — SIMETHICONE 80 MG PO CHEW
80.0000 mg | CHEWABLE_TABLET | Freq: Four times a day (QID) | ORAL | Status: DC | PRN
  Administered 2022-01-29 – 2022-01-30 (×2): 80 mg via ORAL
  Filled 2022-01-29 (×2): qty 1

## 2022-01-29 MED ORDER — LIDOCAINE 2% (20 MG/ML) 5 ML SYRINGE
INTRAMUSCULAR | Status: DC | PRN
  Administered 2022-01-29: 100 mg via INTRAVENOUS
  Administered 2022-01-29: 1.5 mg/kg/h via INTRAVENOUS

## 2022-01-29 MED ORDER — MIDAZOLAM HCL 5 MG/5ML IJ SOLN
INTRAMUSCULAR | Status: DC | PRN
  Administered 2022-01-29: 2 mg via INTRAVENOUS

## 2022-01-29 MED ORDER — MORPHINE SULFATE (PF) 2 MG/ML IV SOLN: 1.0000 mg | INTRAVENOUS | Status: DC | PRN

## 2022-01-29 MED ORDER — KETAMINE HCL 10 MG/ML IJ SOLN
INTRAMUSCULAR | Status: AC
  Filled 2022-01-29: qty 1

## 2022-01-29 MED ORDER — BUPIVACAINE HCL 0.25 % IJ SOLN
INTRAMUSCULAR | Status: DC | PRN
  Administered 2022-01-29: 30 mL

## 2022-01-29 MED ORDER — ROCURONIUM BROMIDE 10 MG/ML (PF) SYRINGE
PREFILLED_SYRINGE | INTRAVENOUS | Status: AC
  Filled 2022-01-29: qty 10

## 2022-01-29 SURGICAL SUPPLY — 61 items
APPLIER CLIP ROT 13.4 12 LRG (CLIP) ×1
BAG COUNTER SPONGE SURGICOUNT (BAG) IMPLANT
BAG LAPAROSCOPIC 12 15 PORT 16 (BASKET) ×1 IMPLANT
BAG RETRIEVAL 12/15 (BASKET) ×1
BENZOIN TINCTURE PRP APPL 2/3 (GAUZE/BANDAGES/DRESSINGS) ×1 IMPLANT
BLADE SURG SZ11 CARB STEEL (BLADE) ×1 IMPLANT
BNDG ADH 1X3 SHEER STRL LF (GAUZE/BANDAGES/DRESSINGS) ×6 IMPLANT
CABLE HIGH FREQUENCY MONO STRZ (ELECTRODE) IMPLANT
CHLORAPREP W/TINT 26 (MISCELLANEOUS) ×1 IMPLANT
CLIP APPLIE ROT 13.4 12 LRG (CLIP) IMPLANT
COVER SURGICAL LIGHT HANDLE (MISCELLANEOUS) ×1 IMPLANT
DRAPE UTILITY XL STRL (DRAPES) ×2 IMPLANT
ELECT REM PT RETURN 15FT ADLT (MISCELLANEOUS) ×1 IMPLANT
GAUZE 4X4 16PLY ~~LOC~~+RFID DBL (SPONGE) ×1 IMPLANT
GLOVE BIOGEL PI IND STRL 7.0 (GLOVE) ×1 IMPLANT
GLOVE SURG SS PI 7.0 STRL IVOR (GLOVE) ×1 IMPLANT
GOWN STRL REUS W/ TWL LRG LVL3 (GOWN DISPOSABLE) ×1 IMPLANT
GOWN STRL REUS W/ TWL XL LVL3 (GOWN DISPOSABLE) IMPLANT
GOWN STRL REUS W/TWL LRG LVL3 (GOWN DISPOSABLE) ×1
GOWN STRL REUS W/TWL XL LVL3 (GOWN DISPOSABLE)
GRASPER SUT TROCAR 14GX15 (MISCELLANEOUS) ×1 IMPLANT
IRRIG SUCT STRYKERFLOW 2 WTIP (MISCELLANEOUS) ×1
IRRIGATION SUCT STRKRFLW 2 WTP (MISCELLANEOUS) ×1 IMPLANT
KIT BASIN OR (CUSTOM PROCEDURE TRAY) ×1 IMPLANT
KIT TURNOVER KIT A (KITS) IMPLANT
MARKER SKIN DUAL TIP RULER LAB (MISCELLANEOUS) ×1 IMPLANT
MAT PREVALON FULL STRYKER (MISCELLANEOUS) IMPLANT
NDL SPNL 22GX3.5 QUINCKE BK (NEEDLE) ×1 IMPLANT
NEEDLE SPNL 22GX3.5 QUINCKE BK (NEEDLE) ×1 IMPLANT
PACK UNIVERSAL I (CUSTOM PROCEDURE TRAY) ×1 IMPLANT
RELOAD STAPLE 60 3.6 BLU REG (STAPLE) IMPLANT
RELOAD STAPLE 60 3.8 GOLD REG (STAPLE) IMPLANT
RELOAD STAPLE 60 4.1 GRN THCK (STAPLE) IMPLANT
RELOAD STAPLER BLUE 60MM (STAPLE) ×3 IMPLANT
RELOAD STAPLER GOLD 60MM (STAPLE) ×2 IMPLANT
RELOAD STAPLER GREEN 60MM (STAPLE) IMPLANT
SCISSORS LAP 5X45 EPIX DISP (ENDOMECHANICALS) IMPLANT
SET TUBE SMOKE EVAC HIGH FLOW (TUBING) ×1 IMPLANT
SHEARS HARMONIC ACE PLUS 45CM (MISCELLANEOUS) ×1 IMPLANT
SLEEVE GASTRECTOMY 40FR VISIGI (MISCELLANEOUS) ×1 IMPLANT
SLEEVE Z-THREAD 5X100MM (TROCAR) ×2 IMPLANT
SOL ANTI FOG 6CC (MISCELLANEOUS) ×1 IMPLANT
SOLUTION ANTI FOG 6CC (MISCELLANEOUS) ×1
SPIKE FLUID TRANSFER (MISCELLANEOUS) ×1 IMPLANT
STAPLER ECHELON LONG 3000 60 (ENDOMECHANICALS) ×1 IMPLANT
STAPLER ECHELON LONG 60 440 (INSTRUMENTS) ×1 IMPLANT
STAPLER RELOAD BLUE 60MM (STAPLE) ×3
STAPLER RELOAD GOLD 60MM (STAPLE) ×2
STAPLER RELOAD GREEN 60MM (STAPLE)
STRIP CLOSURE SKIN 1/2X4 (GAUZE/BANDAGES/DRESSINGS) ×1 IMPLANT
SUT ETHIBOND 0 36 GRN (SUTURE) IMPLANT
SUT MNCRL AB 4-0 PS2 18 (SUTURE) ×1 IMPLANT
SUT VICRYL 0 TIES 12 18 (SUTURE) ×1 IMPLANT
SYR 20ML LL LF (SYRINGE) ×1 IMPLANT
SYR 50ML LL SCALE MARK (SYRINGE) ×1 IMPLANT
SYS KII OPTICAL ACCESS 15MM (TROCAR) ×1
SYSTEM KII OPTICAL ACCESS 15MM (TROCAR) ×1 IMPLANT
TOWEL OR 17X26 10 PK STRL BLUE (TOWEL DISPOSABLE) ×1 IMPLANT
TOWEL OR NON WOVEN STRL DISP B (DISPOSABLE) ×1 IMPLANT
TROCAR Z-THREAD OPTICAL 5X100M (TROCAR) ×1 IMPLANT
TUBING CONNECTING 10 (TUBING) ×2 IMPLANT

## 2022-01-29 NOTE — H&P (Signed)
Chief Complaint: Follow-up (Follow up )   History of Present Illness: Laura Werner is a 36 y.o. female who is seen today for bariatric preop appointment.  She has no new medications and no new medical issues.  Review of Systems: A complete review of systems was obtained from the patient. I have reviewed this information and discussed as appropriate with the patient. See HPI as well for other ROS.  Review of Systems  Constitutional: Negative.  HENT: Negative.  Eyes: Negative.  Respiratory: Negative.  Cardiovascular: Negative.  Gastrointestinal: Negative.  Genitourinary: Negative.  Musculoskeletal: Negative.  Skin: Negative.  Neurological: Negative.  Endo/Heme/Allergies: Negative.  Psychiatric/Behavioral: Negative.   Medical History: Past Medical History:  Diagnosis Date  Hypertension   There is no problem list on file for this patient.  Past Surgical History:  Procedure Laterality Date  ACL Replacement/Meniscus Repair 02/07/2017    No Known Allergies  Current Outpatient Medications on File Prior to Visit  Medication Sig Dispense Refill  ADDERALL XR 20 mg XR capsule  lisinopriL-hydroCHLOROthiazide (ZESTORETIC) 20-25 mg tablet Take 1 tablet by mouth once daily   No current facility-administered medications on file prior to visit.   Family History  Problem Relation Age of Onset  Skin cancer Mother  Obesity Mother  High blood pressure (Hypertension) Mother  Diabetes Mother  Breast cancer Mother  High blood pressure (Hypertension) Father  Obesity Father    Social History   Tobacco Use  Smoking Status Never  Smokeless Tobacco Never    Social History   Socioeconomic History  Marital status: Single  Tobacco Use  Smoking status: Never  Smokeless tobacco: Never  Substance and Sexual Activity  Alcohol use: Yes  Comment: Socially/Occasionally  Drug use: Never   Objective:   Vitals:  01/04/22 1438  BP: 100/80  Pulse: 92  Temp: 36.5 C (97.7  F)  SpO2: 94%  Weight: (!) 132.5 kg (292 lb)  Height: 200.7 cm ('6\' 7"'$ )   Body mass index is 32.9 kg/m.  Physical Exam Constitutional:  Appearance: Normal appearance.  HENT:  Head: Normocephalic and atraumatic.  Pulmonary:  Effort: Pulmonary effort is normal.  Musculoskeletal:  General: Normal range of motion.  Cervical back: Normal range of motion.  Neurological:  General: No focal deficit present.  Mental Status: She is alert and oriented to person, place, and time. Mental status is at baseline.  Psychiatric:  Mood and Affect: Mood normal.  Behavior: Behavior normal.  Thought Content: Thought content normal.    Labs, Imaging and Diagnostic Testing:  UGI reviewed. I reviewed notes by Clarisa Kindred  Assessment and Plan:   There are no diagnoses linked to this encounter.   The patient meets weight loss surgery criteria. Due to the above reasons, I think minimally invasive vertical sleeve gastrectomy is the best option for the patient.   We discussed sleeve gastrectomy. We discussed the preoperative, operative and postoperative process. I explained the surgery in detail including the performance of an EGD near the end of the surgery to test for leak. We discussed the typical hospital course including a 1-2 day stay baring any complications. The patient was given educational material. I quoted the patient that most patients can lose up to 50-70% of their excess weight. We did discuss the possibility of weight regain several years after the procedure.   The risks of infection, bleeding, pain, scarring, weight regain, too little or too much weight loss, vitamin deficiencies and need for lifelong vitamin supplementation, hair loss, need for protein supplementation, leaks,  stricture, reflux, food intolerance, gallstone formation, hernia, need for reoperation, need for open surgery, injury to spleen or surrounding structures, DVT's, PE, and death again discussed with the patient and  the patient expressed understanding and desires to proceed with minimally invasive sleeve gastrectomy, possible open, intraoperative endoscopy.  We discussed that before and after surgery that there would be an alteration in their diet. I explained that we may put them on a diet 2 weeks before surgery. I also explained that they would be on a liquid diet for 2 weeks after surgery. We discussed that they would have to avoid certain foods after surgery. We discussed the importance of physical activity as well as compliance with our dietary and supplement recommendations and routine follow-up.

## 2022-01-29 NOTE — Progress Notes (Signed)

## 2022-01-29 NOTE — Anesthesia Postprocedure Evaluation (Signed)
Anesthesia Post Note  Patient: Natascha Edmonds  Procedure(s) Performed: LAPAROSCOPIC SLEEVE GASTRECTOMY (Abdomen) UPPER GI ENDOSCOPY (Abdomen)     Patient location during evaluation: PACU Anesthesia Type: General Level of consciousness: awake and alert Pain management: pain level controlled Vital Signs Assessment: post-procedure vital signs reviewed and stable Respiratory status: spontaneous breathing, nonlabored ventilation, respiratory function stable and patient connected to nasal cannula oxygen Cardiovascular status: blood pressure returned to baseline and stable Postop Assessment: no apparent nausea or vomiting Anesthetic complications: no   No notable events documented.  Last Vitals:  Vitals:   01/29/22 1055 01/29/22 1151  BP: 114/62 112/71  Pulse: (!) 51 (!) 55  Resp: 17 17  Temp: 36.5 C (!) 36.3 C  SpO2: 98% 97%    Last Pain:  Vitals:   01/29/22 1151  TempSrc: Oral  PainSc:                  Dejanira Pamintuan

## 2022-01-29 NOTE — Discharge Instructions (Signed)

## 2022-01-29 NOTE — Progress Notes (Signed)
PHARMACY CONSULT FOR:  Risk Assessment for Post-Discharge VTE Following Bariatric Surgery  Post-Discharge VTE Risk Assessment: This patient's probability of 30-day post-discharge VTE is increased due to the factors marked: x Sleeve gastrectomy   Liver disorder (transplant, cirrhosis, or nonalcoholic steatohepatitis)   Hx of VTE   Hemorrhage requiring transfusion   GI perforation, leak, or obstruction   ====================================================    Female    Age >/=60 years    BMI >/=50 kg/m2    CHF    Dyspnea at Rest    Paraplegia  x  Non-gastric-band surgery    Operation Time >/=3 hr    Return to OR     Length of Stay >/= 3 d   Hypercoagulable condition   Significant venous stasis      Predicted probability of 30-day post-discharge VTE: 0.16%  Other patient-specific factors to consider: N/A   Recommendation for Discharge: No pharmacologic prophylaxis post-discharge    Laura Werner is a 36 y.o. female who underwent laparoscopic sleeve gastrectomy on 01/29/2022.    Case start: 0752 Case end: 0845   No Known Allergies  Patient Measurements: Height: '5\' 7"'$  (170.2 cm) Weight: 131.3 kg (289 lb 6.4 oz) IBW/kg (Calculated) : 61.6 Body mass index is 45.33 kg/m.  Recent Labs    01/29/22 0954  WBC 12.3*  HGB 12.8  HCT 39.3  PLT 159   Estimated Creatinine Clearance: 138.7 mL/min (by C-G formula based on SCr of 0.65 mg/dL).    Past Medical History:  Diagnosis Date   ADHD (attention deficit hyperactivity disorder), combined type    Benign joint hypermobility    HTN (hypertension)    Insulin resistance    Nausea and vomiting 05/03/2021   Obesity      Medications Prior to Admission  Medication Sig Dispense Refill Last Dose   amphetamine-dextroamphetamine (ADDERALL XR) 20 MG 24 hr capsule Take 1 capsule (20 mg total) by mouth every morning. 30 capsule 0 Past Week   lisinopril-hydrochlorothiazide (ZESTORETIC) 20-25 MG tablet Take 1 tablet by  mouth daily. 90 tablet 3 01/28/2022   levocetirizine (XYZAL) 5 MG tablet Take 1 tablet (5 mg total) by mouth every evening. (Patient taking differently: Take 5 mg by mouth at bedtime as needed for allergies.) 90 tablet 3 More than a month       Laura Werner M 01/29/2022,10:31 AM

## 2022-01-29 NOTE — Op Note (Signed)
   Patient: Laura Werner (April 25, 1986, [id number])  Date of Surgery: 01/29/2022   Preoperative Diagnosis: MORBID OBESITY   Postoperative Diagnosis: MORBID OBESITY   Surgical Procedure: Upper Endoscopy   Surgeon: Louanna Raw, MD  Anesthesiologist: Janeece Riggers, MD CRNA: Gean Maidens, CRNA   Anesthesia: General   Fluids:  Total I/O In: 1100 [I.V.:1000; IV Piggyback:100] Out: 10 [ELFYB:01]  Complications: None  Drains:  None  Specimen: None   Indications for Procedure: [name] is a 36 y.o. female undergoing laparoscopic sleeve gastrectomy and an EGD was requested to evaluate foregut anatomy intraoperatively.  Description of Procedure: During the procedure, I scrubbed out and obtained the Olympus endoscope. I gently placed endoscope in the patient's oropharynx and gently glided it down the esophagus without any difficulty under direct visualization.  The scope was advanced as far as the pylorus and then slowly withdrawn to inspect the foregut anatomy.  Dr. Kieth Brightly had placed saline in the upper abdomen and all staple lines were submerged to ensure no air leak. There was no evidence of bubbles. There was no evidence of intraluminal bleeding and the mucosa appeared healthy.  The lumen was widely patent without evidence of stricture.  The intraluminal insufflation was decompressed. The scope was withdrawn. The patient tolerated this portion of the procedure well. Please see Dr Amie Portland operative note for details regarding the remainder of the procedure.    Louanna Raw, MD General, Bariatric, & Minimally Invasive Surgery [facility], [place]

## 2022-01-29 NOTE — Transfer of Care (Signed)
Immediate Anesthesia Transfer of Care Note  Patient: Laura Werner  Procedure(s) Performed: LAPAROSCOPIC SLEEVE GASTRECTOMY (Abdomen) UPPER GI ENDOSCOPY (Abdomen)  Patient Location: PACU  Anesthesia Type:General  Level of Consciousness: awake, alert  and oriented  Airway & Oxygen Therapy: Patient Spontanous Breathing and Patient connected to face mask oxygen  Post-op Assessment: Report given to RN and Post -op Vital signs reviewed and stable  Post vital signs: Reviewed and stable  Last Vitals:  Vitals Value Taken Time  BP 126/79 01/29/22 0856  Temp    Pulse 84 01/29/22 0857  Resp 22 01/29/22 0857  SpO2 100 % 01/29/22 0857  Vitals shown include unvalidated device data.  Last Pain:  Vitals:   01/29/22 0624  TempSrc:   PainSc: 0-No pain      Patients Stated Pain Goal: 4 (98/33/82 5053)  Complications: No notable events documented.

## 2022-01-29 NOTE — Op Note (Signed)
Preop Diagnosis: Obesity Class III  Postop Diagnosis: same  Procedure performed: laparoscopic Sleeve Gastrectomy  Assitant: Louanna Raw  Indications:  The patient is a 36 y.o. year-old morbidly obese female who has been followed in the Bariatric Clinic as an outpatient. This patient was diagnosed with morbid obesity with a BMI of Body mass index is 45.33 kg/m. and significant co-morbidities including hypertension and GERD.  The patient was counseled extensively in the Bariatric Outpatient Clinic and after a thorough explanation of the risks and benefits of surgery (including death from complications, bowel leak, infection such as peritonitis and/or sepsis, internal hernia, bleeding, need for blood transfusion, bowel obstruction, organ failure, pulmonary embolus, deep venous thrombosis, wound infection, incisional hernia, skin breakdown, and others entailed on the consent form) and after a compliant diet and exercise program, the patient was scheduled for an elective laparoscopic sleeve gastrectomy.  Description of Operation:  Following informed consent, the patient was taken to the operating room and placed on the operating table in the supine position.  She had previously received prophylactic antibiotics and subcutaneous heparin for DVT prophylaxis in the pre-op holding area.  After induction of general endotracheal anesthesia by the anesthesiologist, the patient underwent placement of sequential compression devices and an oro-gastric tube.  A timeout was confirmed by the surgery and anesthesia teams.  The patient was adequately padded at all pressure points and placed on a footboard to prevent slippage from the OR table during extremes of position during surgery.  She underwent a routine sterile prep and drape of her entire abdomen.    Next, A transverse incision was made under the left subcostal area and a 61m optical viewing trocar was introduced into the peritoneal cavity. Pneumoperitoneum  was applied with a high flow and low pressure. A laparoscope was inserted to confirm placement. A extraperitoneal block was then placed at the lateral abdominal wall using exparel diluted with marcaine. 5 additional incisions were placed: 1 561mtrocar to the left of the midline. 1 additional 1m43mrocar in the left lateral area, 1 31m61mocar in the right mid abdomen, 1 1mm 62mcar in the right subcostal area, and a Nathanson retractor was placed through a subxiphoid incision.  Next, a hole was created through the lesser omentum along the greater curve of the stomach to enter the lesser sac. The vessels along the greater omentum were  Then ligated and divided using the Harmonic scalpel moving towards the spleen and then short gastric vessels were ligated and divided in the same fashion to fully mobilize the fundus. The left crus was identified to ensure completion of the dissection. Next the antrum was measured and dissection continued inferiorly along the greater curve towards the pylorus and stopped 6cm from the pylorus.   A 40Fr ViSiGi dilator was placed into the esophgaus and along the lesser curve of the stomach and placed on suction.  2 60mm 67m load echelon stapler(s) followed by 3 60mm b59mload echelon stapler(s) were used to make the resection along the antrum being sure to stay well away from the angularis by angling the jaws of the stapler towards the greater curve and later completing the resection staying along the ViSiGi [place] the fundus was not retained by appropriately retracting it lateral. Air was inserted through the ViSiGi Cascadeform a leak test showing no bubbles and a neutral lie of the stomach.  The assistant then went and performed an upper endoscopy and leak test. No bubbles were seen and the sleeve and antrum  distended appropriately. The specimen was then placed in an endocatch bag and removed by the 14m port. The fascia of the 189mport was closed with a 0 vicryl by suture  passer. Hemostasis was ensured. The pressure was changed to 8 mm Hg. There were 2 areas of slow bleeding that were clipped with titanium clip applier. Pneumoperitoneum was evacuated, all ports were removed and all incisions closed with 4-0 monocryl suture in subcuticular fashion. Steristrips and bandaids were put in place for dressing. The patient awoke from anesthesia and was brought to pacu in stable condition. All counts were correct.  Estimated blood loss: <3061mSpecimens:  Sleeve gastrectomy  Local Anesthesia: 50 ml Exparel:0.5% Marcaine mix  Post-Op Plan:       Pain Management: PO, prn      Antibiotics: Prophylactic      Anticoagulation: Prophylactic, Starting now      Post Op Studies/Consults: Not applicable      Intended Discharge: within 48h      Intended Outpatient Follow-Up: Two Week      Intended Outpatient Studies: Not Applicable      Other: Not Applicable  Laura Werner

## 2022-01-29 NOTE — Anesthesia Procedure Notes (Signed)
Procedure Name: Intubation Date/Time: 01/29/2022 7:48 AM  Performed by: Gean Maidens, CRNAPre-anesthesia Checklist: Patient identified, Emergency Drugs available, Suction available, Patient being monitored and Timeout performed Patient Re-evaluated:Patient Re-evaluated prior to induction Oxygen Delivery Method: Circle system utilized Preoxygenation: Pre-oxygenation with 100% oxygen Induction Type: IV induction Ventilation: Mask ventilation without difficulty Laryngoscope Size: Mac and 4 Grade View: Grade I Tube type: Oral Tube size: 7.5 mm Number of attempts: 1 Airway Equipment and Method: Stylet Placement Confirmation: ETT inserted through vocal cords under direct vision, positive ETCO2 and breath sounds checked- equal and bilateral Secured at: 21 cm Tube secured with: Tape Dental Injury: Teeth and Oropharynx as per pre-operative assessment

## 2022-01-30 ENCOUNTER — Encounter (HOSPITAL_COMMUNITY): Payer: Self-pay | Admitting: General Surgery

## 2022-01-30 LAB — CBC WITH DIFFERENTIAL/PLATELET
Abs Immature Granulocytes: 0.03 10*3/uL (ref 0.00–0.07)
Basophils Absolute: 0 10*3/uL (ref 0.0–0.1)
Basophils Relative: 0 %
Eosinophils Absolute: 0 10*3/uL (ref 0.0–0.5)
Eosinophils Relative: 0 %
HCT: 36.8 % (ref 36.0–46.0)
Hemoglobin: 12.1 g/dL (ref 12.0–15.0)
Immature Granulocytes: 0 %
Lymphocytes Relative: 11 %
Lymphs Abs: 1 10*3/uL (ref 0.7–4.0)
MCH: 27.3 pg (ref 26.0–34.0)
MCHC: 32.9 g/dL (ref 30.0–36.0)
MCV: 82.9 fL (ref 80.0–100.0)
Monocytes Absolute: 0.5 10*3/uL (ref 0.1–1.0)
Monocytes Relative: 6 %
Neutro Abs: 7.3 10*3/uL (ref 1.7–7.7)
Neutrophils Relative %: 83 %
Platelets: 196 10*3/uL (ref 150–400)
RBC: 4.44 MIL/uL (ref 3.87–5.11)
RDW: 13.2 % (ref 11.5–15.5)
WBC: 8.8 10*3/uL (ref 4.0–10.5)
nRBC: 0 % (ref 0.0–0.2)

## 2022-01-30 LAB — SURGICAL PATHOLOGY

## 2022-01-30 MED ORDER — ONDANSETRON 4 MG PO TBDP: 4.0000 mg | ORAL_TABLET | Freq: Four times a day (QID) | ORAL | 0 refills | Status: DC | PRN

## 2022-01-30 MED ORDER — PANTOPRAZOLE SODIUM 40 MG PO TBEC: 40.0000 mg | DELAYED_RELEASE_TABLET | Freq: Every day | ORAL | 0 refills | Status: DC

## 2022-01-30 MED ORDER — ACETAMINOPHEN 500 MG PO TABS: 1000.0000 mg | ORAL_TABLET | Freq: Three times a day (TID) | ORAL | 0 refills | Status: AC

## 2022-01-30 NOTE — Progress Notes (Signed)
Patient alert and oriented, pain is controlled. Patient is tolerating fluids, advanced to protein shake today, patient is tolerating well.  Reviewed Gastric sleeve discharge instructions with patient and patient is able to articulate understanding.  Provided information on BELT program, Support Group and [facility] outpatient pharmacy. All questions answered, will continue to monitor.   24 hr fluid recall prior to discharge: 563m. Per dehydration order, will f/u with pt within one week post op.

## 2022-01-30 NOTE — Discharge Summary (Signed)
Physician Discharge Summary  [name] UXN:[id number] DOB: Jul 26, 1985 DOA: 01/29/2022  PCP: Orma Render, NP  Admit date: 01/29/2022 Discharge date: 01/30/2022  Recommendations for Outpatient Follow-up:   (include homehealth, outpatient follow-up instructions, specific recommendations for PCP to follow-up on, etc.)   Follow-up Information     Laura Werner, [name], MD. Go on 02/21/2022.   Specialty: General Surgery Why: at 10am. Please arrive 15 minutes prior to your appointment time.  Thank you. Contact information: [address] [place] [address] [phone]         Laura Werner, [name], MD. Go on 03/15/2022.   Specialty: General Surgery Why: at 1:40pm. Please arrive 15 minutes prior to your appointment time. Thank you. Contact information: [address] [place] [address] [phone]                Discharge Diagnoses:  Principal Problem:   Morbid (severe) obesity due to excess calories [facility])   Surgical Procedure: laparoscopic sleeve gastrectomy, upper endoscopy  Discharge Condition: Good Disposition: Home  Diet recommendation: Postoperative sleeve gastrectomy diet (liquids only)  Filed Weights   01/29/22 0545  Weight: 131.3 kg     Hospital Course:  The patient was admitted after undergoing laparoscopic sleeve gastrectomy. POD 0 she ambulated well. POD 1 she was started on the water diet protocol and tolerated 500 ml in the first shift. Once meeting the water amount she was advanced to bariatric protein shakes which they tolerated and were discharged home POD 1.  Treatments: surgery: laparoscopic sleeve gastrectomy  Discharge Instructions  Discharge Instructions     Ambulate hourly while awake   Complete by: As directed    Call MD for:  difficulty breathing, headache or visual disturbances   Complete by: As directed    Call MD for:  persistant dizziness or light-headedness   Complete by: As directed     Call MD for:  persistant nausea and vomiting   Complete by: As directed    Call MD for:  redness, tenderness, or signs of infection (pain, swelling, redness, odor or green/yellow discharge around incision site)   Complete by: As directed    Call MD for:  severe uncontrolled pain   Complete by: As directed    Call MD for:  temperature >101 F   Complete by: As directed    Diet bariatric full liquid   Complete by: As directed    Discharge wound care:   Complete by: As directed    Remove Bandaids tomorrow, ok to shower tomorrow. Steristrips may fall off in 1-3 weeks.   Incentive spirometry   Complete by: As directed    Perform hourly while awake      Allergies as of 01/30/2022   No Known Allergies      Medication List     STOP taking these medications    lisinopril-hydrochlorothiazide 20-25 MG tablet Commonly known as: ZESTORETIC       TAKE these medications    acetaminophen 500 MG tablet Commonly known as: TYLENOL Take 2 tablets (1,000 mg total) by mouth every 8 (eight) hours for 5 days.   amphetamine-dextroamphetamine 20 MG 24 hr capsule Commonly known as: ADDERALL XR Take 1 capsule (20 mg total) by mouth every morning.   levocetirizine 5 MG tablet Commonly known as: XYZAL Take 1 tablet (5 mg total) by mouth every evening. What changed:  when to take this reasons to take this   ondansetron 4 MG disintegrating tablet Commonly  known as: ZOFRAN-ODT Take 1 tablet (4 mg total) by mouth every 6 (six) hours as needed for nausea or vomiting.   pantoprazole 40 MG tablet Commonly known as: PROTONIX Take 1 tablet (40 mg total) by mouth daily.               Discharge Care Instructions  (From admission, onward)           Start     Ordered   01/30/22 0000  Discharge wound care:       Comments: Remove Bandaids tomorrow, ok to shower tomorrow. Steristrips may fall off in 1-3 weeks.   01/30/22 1109            Follow-up Information     [place]rowning [place],  [name], MD. Go on 02/21/2022.   Specialty: General Surgery Why: at 10am. Please arrive 15 minutes prior to your appointment time.  Thank you. Contact information: [address] [place] [address] [phone]         Amorita Vanrossum, [name], MD. Go on 03/15/2022.   Specialty: General Surgery Why: at 1:40pm. Please arrive 15 minutes prior to your appointment time. Thank you. Contact information: [address] [place] [address] [phone]                  The results of significant diagnostics from this hospitalization (including imaging, microbiology, ancillary and laboratory) are listed below for reference.    Significant Diagnostic Studies: No results found.  Labs: Basic Metabolic Panel: Recent Labs  Lab 01/29/22 0954  CREATININE 0.79   Liver Function Tests: No results for input(s): "AST", "ALT", "ALKPHOS", "BILITOT", "PROT", "ALBUMIN" in the last 168 hours.  CBC: Recent Labs  Lab 01/29/22 0954 01/30/22 0411  WBC 12.3* 8.8  NEUTROABS  --  7.3  HGB 12.8  12.8 12.1  HCT 40.2  39.3 36.8  MCV 83.3 82.9  PLT 159 196    CBG: No results for input(s): "GLUCAP" in the last 168 hours.  Principal Problem:   Morbid (severe) obesity due to excess calories ([name])   VTE plan: no chemical prophylaxis recommended [email or web address])  Time coordinating discharge: 15 min

## 2022-01-30 NOTE — Progress Notes (Signed)
  Transition of Care [facility]) Screening Note   Patient Details  Name: Laura Werner Date of Birth: Mar 08, 1986   Transition of Care [facility]) CM/SW Contact:    Lennart Pall, LCSW Phone Number: 01/30/2022, 9:56 AM    Transition of Care Department [facility]) has reviewed patient and no TOC needs have been identified at this time. We will continue to monitor patient advancement through interdisciplinary progression rounds. If new patient transition needs arise, please place a TOC consult.

## 2022-01-30 NOTE — Progress Notes (Signed)
Discharge instructions given to patient and all questions were answered.  

## 2022-02-02 ENCOUNTER — Telehealth (HOSPITAL_COMMUNITY): Payer: Self-pay | Admitting: *Deleted

## 2022-02-02 NOTE — Telephone Encounter (Signed)
1.  Tell me about your pain and pain management? Pt c/o right lower abdominal incisional pain with movement and exertion.  Pt states that she has been using a heating pad. Discussed with patient to try and splint her abdomen with changing positions to assist with the discomfort.  Encouraged pt to try options and/or contact [facility] if still concerned.   2.  Let's talk about fluid intake.  How much total fluid are you taking in? Pt states that she is getting in at least 50-55oz of fluid including protein shakes, bottled water, and broth. Pt states that she is working to meet goal of 64 oz of fluid today.  Pt encouraged to continue to work towards meeting goal.  Pt instructed to assess status and suggestions daily utilizing Hydration Action Plan on discharge folder and to call [facility] if in the "red zone".   3.  How much protein have you taken in the last 2 days? Was able to consume 1.5 protein shakes yesterday.  Pt plans to drink two complete protein shakes today to meet goal.  4.  Have you had nausea?  Tell me about when have experienced nausea and what you did to help? Pt denies nausea.   5.  Has the frequency or color changed with your urine? Pt states that she is urinating "fine" with no changes in frequency or urgency.     6.  Tell me what your incisions look like? "Incisions look fine". Pt denies a fever, chills.  Pt states incisions are not swollen, open, or draining.  Pt encouraged to call [facility] if incisions change.   7.  Have you been passing gas? BM? Pt states that she has not had a BM, but has taken Miralax.  Pt instructed to take either Miralax or MoM as instructed per "Gastric Bypass/Sleeve Discharge Home Care Instructions".  Pt to call surgeon's office if not able to have BM with medication.   8.  If a problem or question were to arise who would you call?  Do you know contact numbers for [place], [facility], and [facility]? Pt denies dehydration symptoms.  Pt can describe s/sx of dehydration.  Pt knows to  call [facility] for surgical, [facility] for nutrition, and [place] for non-urgent questions or concerns.   9.  How has the walking going? Pt states she is walking around and able to be active without difficulty.   10. Are you still using your incentive spirometer?  If so, how often? Pt states that she is doing the I.S. some.   Pt encouraged to use incentive spirometer, at least 10x every hour while awake until she sees the surgeon.  11.  How are your vitamins and calcium going?  How are you taking them? Pt states that she will begin the supplements today.  Reinforced education about taking supplements at least two hours apart.  Reminded patient that the first 30 days post-operatively are important for successful recovery.  Practice good hand hygiene, wearing a mask when appropriate (since optional in most places), and minimizing exposure to people who live outside of the home, especially if they are exhibiting any respiratory, GI, or illness-like symptoms.

## 2022-02-07 ENCOUNTER — Ambulatory Visit (INDEPENDENT_AMBULATORY_CARE_PROVIDER_SITE_OTHER): Payer: 59 | Admitting: Family Medicine

## 2022-02-07 ENCOUNTER — Encounter (HOSPITAL_BASED_OUTPATIENT_CLINIC_OR_DEPARTMENT_OTHER): Payer: Self-pay | Admitting: Family Medicine

## 2022-02-07 DIAGNOSIS — R21 Rash and other nonspecific skin eruption: Secondary | ICD-10-CM | POA: Diagnosis not present

## 2022-02-07 MED ORDER — TRIAMCINOLONE ACETONIDE 0.1 % EX LOTN: 1.0000 | TOPICAL_LOTION | Freq: Three times a day (TID) | CUTANEOUS | 0 refills | Status: DC | PRN

## 2022-02-07 MED ORDER — HYDROXYZINE PAMOATE 25 MG PO CAPS: 25.0000 mg | ORAL_CAPSULE | Freq: Three times a day (TID) | ORAL | 0 refills | Status: DC | PRN

## 2022-02-07 MED ORDER — CLOTRIMAZOLE 1 % EX CREA: 1.0000 | TOPICAL_CREAM | Freq: Two times a day (BID) | CUTANEOUS | 0 refills | Status: DC

## 2022-02-07 NOTE — Patient Instructions (Signed)
  Medication Instructions:  Your physician recommends that you continue on your current medications as directed. Please refer to the Current Medication list given to you today. --If you need a refill on any your medications before your next appointment, please call your pharmacy first. If no refills are authorized on file call the office.-- Lab Work: Your physician has recommended that you have lab work today: No If you have labs (blood work) drawn today and your tests are completely normal, you will receive your results via [facility] a phone call from our staff.  Please ensure you check your voicemail in the event that you authorized detailed messages to be left on a delegated number. If you have any lab test that is abnormal or we need to change your treatment, we will call you to review the results.  Referrals/Procedures/Imaging: No  Follow-Up: Your next appointment:   Your physician recommends that you schedule a follow-up appointment as needed (prn) with Jacolyn Reedy, NP.  You will receive a text message or e-mail with a link to a survey about your care and experience with [place] today! We would greatly appreciate your feedback!   Thanks for letting us be apart of your health journey!!  Primary Care and Sports Medicine   Dr. Arlina Robes [place]   We encourage you to activate your patient portal called "MyChart".  Sign up information is provided on this After Visit Summary.  MyChart is used to connect with patients for Virtual Visits (Telemedicine).  Patients are able to view lab/test results, encounter notes, upcoming appointments, etc.  Non-urgent messages can be sent to your provider as well. To learn more about what you can do with MyChart, please visit --  [email or web address].

## 2022-02-07 NOTE — Progress Notes (Signed)
    Procedures performed today:    None.  Independent interpretation of notes and tests performed by another provider:   None.  Brief History, Exam, Impression, and Recommendations:    BP (!) 102/53   Pulse 89   Temp 97.8 F (36.6 C) (Oral)   Ht '5\' 7"'$  (1.702 m)   Wt 280 lb (127 kg)   LMP 01/03/2022 (Approximate)   SpO2 100%   BMI 43.85 kg/m   Rash Patient reports that last week she had bariatric surgery with laparoscopic sleeve gastrectomy.  She then began to notice 2 to 3 days later that she was developing a rash, initially started around incision sites underlying Steri-Strips.  She had contacted her surgeon's office and it was recommended that she remove the Steri-Strips, wash the area as well and apply topical Benadryl and cortisone.  She did this but continued to have persistent itching and redness around incision sites.  She also began to develop rash along sides of abdomen bilaterally and also along the waistline, under breasts bilaterally in skin folds.  She has continued with topical treatments but has not noted significant relief with this.  She denies any issues with fever, chills, sweats, no cough, shortness of breath or wheezing. On exam, patient is in no acute distress, vital signs stable, patient is afebrile.  Overlying abdomen, patient does have laparoscopic surgical incision sites which appear to be clean, dry, intact.  There is notable discharge or drainage from incision sites.  Around each incision site, there is extending erythema with mild warmth and blanching.  No significant tenderness to palpation around surgical sites.  Separately, there are small papular areas scattered along bilateral flanks, these areas are diffuse/infrequent.  Underneath abdominal pannus, she does have some redness in this area as well, no oozing or drainage.  Similar appearing rash within some abdominal folds along flank.  She indicates that she has similar skin changes under bilateral  breasts. Area surrounding incision sites do appear to be allergic reaction, possibly related to Steri-Strip or solution applied placing Steri-Strips.  For this, recommend utilizing topical steroid cream, can also utilize topical antihistamine.  Prescription sent to pharmacy for higher potency steroid cream to be utilized Rash within the skin folds appears most consistent with intertrigo and would recommend beginning antifungal cream for this.  We will proceed with use of clotrimazole to be applied twice daily for 1 to 4 weeks until area is clear Given notable itching, we will try to control this with oral antihistamine, prescription for hydroxyzine sent to pharmacy, discussed potential risk and adverse effects Recommend scheduling sooner follow-up with surgeon for monitoring of skin changes, particularly around surgical sites  Return if symptoms worsen or fail to improve.   ___________________________________________ Taro Hidrogo de [place], MD, ABFM, CAQSM Primary Care and [place]

## 2022-02-07 NOTE — Assessment & Plan Note (Signed)
Patient reports that last week she had bariatric surgery with laparoscopic sleeve gastrectomy.  She then began to notice 2 to 3 days later that she was developing a rash, initially started around incision sites underlying Steri-Strips.  She had contacted her surgeon's office and it was recommended that she remove the Steri-Strips, wash the area as well and apply topical Benadryl and cortisone.  She did this but continued to have persistent itching and redness around incision sites.  She also began to develop rash along sides of abdomen bilaterally and also along the waistline, under breasts bilaterally in skin folds.  She has continued with topical treatments but has not noted significant relief with this.  She denies any issues with fever, chills, sweats, no cough, shortness of breath or wheezing. On exam, patient is in no acute distress, vital signs stable, patient is afebrile.  Overlying abdomen, patient does have laparoscopic surgical incision sites which appear to be clean, dry, intact.  There is notable discharge or drainage from incision sites.  Around each incision site, there is extending erythema with mild warmth and blanching.  No significant tenderness to palpation around surgical sites.  Separately, there are small papular areas scattered along bilateral flanks, these areas are diffuse/infrequent.  Underneath abdominal pannus, she does have some redness in this area as well, no oozing or drainage.  Similar appearing rash within some abdominal folds along flank.  She indicates that she has similar skin changes under bilateral breasts. Area surrounding incision sites do appear to be allergic reaction, possibly related to Steri-Strip or solution applied placing Steri-Strips.  For this, recommend utilizing topical steroid cream, can also utilize topical antihistamine.  Prescription sent to pharmacy for higher potency steroid cream to be utilized Rash within the skin folds appears most consistent with  intertrigo and would recommend beginning antifungal cream for this.  We will proceed with use of clotrimazole to be applied twice daily for 1 to 4 weeks until area is clear Given notable itching, we will try to control this with oral antihistamine, prescription for hydroxyzine sent to pharmacy, discussed potential risk and adverse effects Recommend scheduling sooner follow-up with surgeon for monitoring of skin changes, particularly around surgical sites

## 2022-02-13 ENCOUNTER — Encounter: Payer: 59 | Attending: General Surgery | Admitting: Dietician

## 2022-02-13 ENCOUNTER — Encounter: Payer: Self-pay | Admitting: Dietician

## 2022-02-13 DIAGNOSIS — Z713 Dietary counseling and surveillance: Secondary | ICD-10-CM | POA: Diagnosis not present

## 2022-02-13 DIAGNOSIS — Z6841 Body Mass Index (BMI) 40.0 and over, adult: Secondary | ICD-10-CM | POA: Diagnosis not present

## 2022-02-13 DIAGNOSIS — E669 Obesity, unspecified: Secondary | ICD-10-CM

## 2022-02-13 NOTE — Progress Notes (Signed)
2 Week Post-Operative Nutrition Class   Patient was seen on 10/17/203 for Post-Operative Nutrition education at [facility]    Surgery date: 01/29/2022 Surgery type: Sleeve  Anthropometrics  Start weight at [facility]: 293.5 lbs (date: 12/14/2021)  Height: 67 in Weight today: 273.7 lbs BMI: 43.85 kg/m2     Clinical  Medical hx: HTN, obesity Medications: Lisinopril/HCT2, Adderall XR Labs: 10/05/2021: iron saturation 14;  Notable signs/symptoms: nothing noted Any previous deficiencies? No Bowel Habits: Every day to every other day no complaints   Body Composition Scale 02/13/2022  Current Body Weight 273.7  Total Body Fat % 44.9  Visceral Fat 13  Fat-Free Mass % 55.0   Total Body Water % 42.0  Muscle-Mass lbs 35.4  BMI 42.6  Body Fat Displacement          Torso  lbs 76.3         Left Leg  lbs 15.2         Right Leg  lbs 15.2         Left Arm  lbs 7.6         Right Arm   lbs 7.6      The following the learning objectives were met by the patient during this course: Identifies Phase 3 (Soft, High Proteins) Dietary Goals and will begin from 2 weeks post-operatively to 2 months post-operatively Identifies appropriate sources of fluids and proteins  Identifies appropriate fat sources and healthy verses unhealthy fat types   States protein recommendations and appropriate sources post-operatively Identifies the need for appropriate texture modifications, mastication, and bite sizes when consuming solids Identifies appropriate fat consumption and sources Identifies appropriate multivitamin and calcium sources post-operatively Describes the need for physical activity post-operatively and will follow MD recommendations States when to call healthcare provider regarding medication questions or post-operative complications   Handouts given during class include: Phase 3A: Soft, High Protein Diet Handout Phase 3 High Protein Meals Healthy Fats    Follow-Up Plan: Patient will follow-up at [facility] in 6 weeks for 2 month post-op nutrition visit for diet advancement per MD.

## 2022-02-19 ENCOUNTER — Telehealth: Payer: Self-pay | Admitting: Dietician

## 2022-02-19 NOTE — Telephone Encounter (Signed)
RD called pt to verify fluid intake once starting soft, solid proteins 2 week post-bariatric surgery.   Daily Fluid intake: 40-50 oz. Daily Protein intake: 60 grams Bowel Habits: getting better, stating she used Miralax to reset.  Concerns/issues:  pt states she is working on getting more fluids in.

## 2022-02-26 ENCOUNTER — Encounter (HOSPITAL_BASED_OUTPATIENT_CLINIC_OR_DEPARTMENT_OTHER): Payer: Self-pay | Admitting: Nurse Practitioner

## 2022-02-26 DIAGNOSIS — B009 Herpesviral infection, unspecified: Secondary | ICD-10-CM

## 2022-02-27 MED ORDER — VALACYCLOVIR HCL 1 G PO TABS: 2000.0000 mg | ORAL_TABLET | Freq: Two times a day (BID) | ORAL | 2 refills | Status: DC

## 2022-03-26 ENCOUNTER — Encounter: Payer: Self-pay | Admitting: Dietician

## 2022-03-26 ENCOUNTER — Encounter: Payer: 59 | Attending: Nurse Practitioner | Admitting: Dietician

## 2022-03-26 VITALS — Ht 67.0 in | Wt 264.5 lb

## 2022-03-26 DIAGNOSIS — E669 Obesity, unspecified: Secondary | ICD-10-CM | POA: Insufficient documentation

## 2022-03-26 NOTE — Progress Notes (Signed)
Bariatric Nutrition Follow-Up Visit Medical Nutrition Therapy  Appt Start Time: 8:39   End Time: 9:12  Surgery date: 01/29/2022 Surgery type: Sleeve  NUTRITION ASSESSMENT  Anthropometrics  Start weight at [facility]: 293.5 lbs (date: 12/14/2021)  Height: 67 in Weight today: 264.5 lbs BMI: 41.43 kg/m2     Clinical  Medical hx: HTN, obesity Medications: Lisinopril/HCT2, Adderall XR Labs: 10/05/2021: iron saturation 14;  Notable signs/symptoms: nothing noted Any previous deficiencies? No Bowel Habits: Every day to every other day no complaints   Body Composition Scale 02/13/2022 03/26/2022  Current Body Weight 273.7 264.5  Total Body Fat % 44.9 44.1  Visceral Fat 13 13  Fat-Free Mass % 55.0 55.8   Total Body Water % 42.0 42.4  Muscle-Mass lbs 35.4 35.3  BMI 42.6 41.2  Body Fat Displacement           Torso  lbs 76.3 72.3         Left Leg  lbs 15.2 14.4         Right Leg  lbs 15.2 14.4         Left Arm  lbs 7.6 7.2         Right Arm   lbs 7.6 7.2     Lifestyle & Dietary Hx  Pt states she is still on acid reducer, stating she still gets heartburn at the end of the day. Pt states she has been tolerating food just fine, stating she tried some tai noodles and got sick.  Also stating she experienced dumping syndrome when she had some sugar.  Pt states she learned. Pt states working at night she finds herself snacking to stay awake.  Pt states she is trying to get on day shift. Pt states she can get to work early and workout and shower at work.  Pt also showed interest in the BELT program, stating she can go right after she gets off night shift. Pt states she is off all blood pressure medication.   Estimated daily fluid intake: 40-60 oz Estimated daily protein intake: 60-90 g Supplements: Multivitamin and calcium Current average weekly physical activity: walking daily (dog walk), gym 2-3 days a week. 1 hour (30 minutes of cardio and then resistance)  24-Hr Dietary Recall First  Meal: egg and cheese, coffee Snack: nuts and cheese  Second Meal: 2-3 oz of chicken with cheese or nuts; or tuna salad or taco soup or chili Snack: yogurt  Third Meal: 2-3 oz of chicken with cheese or nuts; or tuna salad or taco soup or chili or ricotta bake Snack:  Beverages: water, Gatorade zero, coffee, herbal tea  Post-Op Goals/ Signs/ Symptoms Using straws: no Drinking while eating: no Chewing/swallowing difficulties: no Changes in vision: no Changes to mood/headaches: no Hair loss/changes to skin/nails: no Difficulty focusing/concentrating: no Sweating: no Limb weakness: no Dizziness/lightheadedness: no Palpitations: no  Carbonated/caffeinated beverages: no N/V/D/C/Gas: no Abdominal pain: no Dumping syndrome: no    NUTRITION DIAGNOSIS  Overweight/obesity ([place]-3.3) related to past poor dietary habits and physical inactivity as evidenced by completed bariatric surgery and following dietary guidelines for continued weight loss and healthy nutrition status.     NUTRITION INTERVENTION Nutrition counseling (C-1) and education (E-2) to facilitate bariatric surgery goals, including: Diet advancement to the next phase (phase 4) now including non-starchy vegetables The importance of consuming adequate calories as well as certain nutrients daily due to the body's need for essential vitamins, minerals, and fats The importance of daily physical activity and to reach a goal of  at least 150 minutes of moderate to vigorous physical activity weekly (or as directed by their physician) due to benefits such as increased musculature and improved lab values The importance of intuitive eating specifically learning hunger-satiety cues and understanding the importance of learning a new body: The importance of mindful eating to avoid grazing behaviors   Goals Increase physical activity and be more consistent (look into BELT) Continue sipping water/fluid in between meals Schedule meals and snacks  to avoid during work to avoid snacking to stay awake   Handouts Provided Include  Phase 4 [place] for Change Teaching method utilized: Visual & Auditory  Demonstrated degree of understanding via: Teach Back  Readiness Level: Action Barriers to learning/adherence to lifestyle change: working nights  RD's Notes for Next Visit Assess adherence to pt chosen goals   MONITORING & EVALUATION Dietary intake, weekly physical activity, body weight.  Next Steps Patient is to follow-up in 4 months for 6 month post-op class.

## 2022-07-10 ENCOUNTER — Encounter: Payer: 59 | Attending: Nurse Practitioner | Admitting: Skilled Nursing Facility1

## 2022-07-10 VITALS — Wt 243.5 lb

## 2022-07-10 DIAGNOSIS — E669 Obesity, unspecified: Secondary | ICD-10-CM | POA: Diagnosis present

## 2022-07-13 ENCOUNTER — Encounter: Payer: Self-pay | Admitting: Skilled Nursing Facility1

## 2022-07-13 NOTE — Progress Notes (Signed)
Follow-up visit:  Post-Operative sleeve Surgery  Medical Nutrition Therapy:  Appt start time: 6:00pm end time:  7:00pm  Primary concerns today: Post-operative Bariatric Surgery Nutrition Management 6 Month Post-Op Class  Surgery date: 01/29/2022 Surgery type: Sleeve  NUTRITION ASSESSMENT  Anthropometrics  Start weight at [facility]: 293.5 lbs (date: 12/14/2021)  Height: 67 in Weight today: 243.5   Clinical  Medical hx: HTN, obesity Medications: Lisinopril/HCT2, Adderall XR Labs: 10/05/2021: iron saturation 14;  Notable signs/symptoms: nothing noted Any previous deficiencies? No Bowel Habits: Every day to every other day no complaints    Body Composition Scale 02/13/2022 03/26/2022 07/10/2022  Current Body Weight 273.7 264.5 243.5  Total Body Fat % 44.9 44.1 41.9  Visceral Fat 13 13 11   Fat-Free Mass % 55.0 55.8 58   Total Body Water % 42.0 42.4 43.5  Muscle-Mass lbs 35.4 35.3 35  BMI 42.6 41.2 37.8  Body Fat Displacement            Torso  lbs 76.3 72.3 63.2         Left Leg  lbs 15.2 14.4 12.6         Right Leg  lbs 15.2 14.4 12.6         Left Arm  lbs 7.6 7.2 6.3         Right Arm   lbs 7.6 7.2 6.3     Information Reviewed/ Discussed During Appointment: -Review of composition scale numbers -Fluid requirements (64-100 ounces) -Protein requirements (60-80g) -Strategies for tolerating diet -Advancement of diet to include Starchy vegetables -Barriers to inclusion of new foods -Inclusion of appropriate multivitamin and calcium supplements  -Exercise recommendations   Fluid intake: adequate   Medications: See List Supplementation: appropriate    Using straws: no Drinking while eating: no Having you been chewing well: yes Chewing/swallowing difficulties: no Changes in vision: no Changes to mood/headaches: no Hair loss/Cahnges to skin/Changes to nails: no Any difficulty focusing or concentrating: no Sweating: no Dizziness/Lightheaded: no Palpitations: no   Carbonated beverages: no N/V/D/C/GAS: no Abdominal Pain: no Dumping syndrome: no  Recent physical activity:  ADL's  Progress Towards Goal(s):  In Progress Teaching method utilized: Visual & Auditory  Demonstrated degree of understanding via: Teach Back  Readiness Level: Action Barriers to learning/adherence to lifestyle change: none identified  Handouts given during visit include: 6  month diet Progression  Goals Sheet The Benefits of Exercise are endless..... Support Group Topics   Teaching Method Utilized:  Visual Auditory Hands on  Demonstrated degree of understanding via:  Teach Back   Monitoring/Evaluation:  Dietary intake, exercise, and body weight. Follow up in 3 months for 9 month post-op visit.

## 2022-10-01 ENCOUNTER — Ambulatory Visit: Payer: 59 | Admitting: Dietician

## 2022-10-02 ENCOUNTER — Encounter: Payer: 59 | Attending: General Surgery | Admitting: Skilled Nursing Facility1

## 2022-10-02 ENCOUNTER — Encounter: Payer: Self-pay | Admitting: Skilled Nursing Facility1

## 2022-10-02 VITALS — Ht 67.0 in | Wt 233.1 lb

## 2022-10-02 DIAGNOSIS — E669 Obesity, unspecified: Secondary | ICD-10-CM | POA: Insufficient documentation

## 2022-10-02 NOTE — Progress Notes (Signed)
Follow-up visit:  Post-Operative sleeve Surgery  Medical Nutrition Therapy:    Primary concerns today: Post-operative Bariatric Surgery Nutrition Management   Surgery date: 01/29/2022 Surgery type: Sleeve  NUTRITION ASSESSMENT  Anthropometrics  Start weight at [facility]: 293.5 lbs (date: 12/14/2021)  Height: 67 in Weight today: 233.1   Clinical  Medical hx: HTN, obesity Medications: Lisinopril/HCT2, Adderall XR Labs: WNL Notable signs/symptoms: nothing noted Any previous deficiencies? No Bowel Habits: Every day to every other day no complaints   Body Composition Scale 03/26/2022 07/10/2022 10/02/2022  Current Body Weight 264.5 243.5 233.1  Total Body Fat % 44.1 41.9 40.7  Visceral Fat 13 11 11   Fat-Free Mass % 55.8 58 59.2   Total Body Water % 42.4 43.5 44.1  Muscle-Mass lbs 35.3 35 34.9  BMI 41.2 37.8 36.2  Body Fat Displacement            Torso  lbs 72.3 63.2 58.8         Left Leg  lbs 14.4 12.6 11.7         Right Leg  lbs 14.4 12.6 11.7         Left Arm  lbs 7.2 6.3 5.8         Right Arm   lbs 7.2 6.3 5.8    Pt states she is frustrated with how hard it is to lose weight. Pt states she did log a few days stating she really cannot log because she will get obsessive.  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  Pt states she weighs herself daily. Pt states she meal preps her meals for work.   24 hr recall: Breakfast: pro yogurt + berries + peanut butter + granola Snack:  Lunch: 2 boiled eggs and tuna and cucumbers Snack: protein shake  Dinner: chicken + salad and vegetable Snack:  Beverages: 40 ounces flavored water, regular coffee, cenna tea    Fluid intake: adequate   Medications: See List Supplementation: appropriate    Using straws: no Drinking while eating: no Having you been chewing well: yes Chewing/swallowing difficulties:  no Changes in vision: no Changes to mood/headaches: no Hair loss/Cahnges to skin/Changes to nails: no Any difficulty focusing or concentrating: no Sweating: no Dizziness/Lightheaded: no Palpitations: no  Carbonated beverages: no N/V/D/C/GAS: no Abdominal Pain: no Dumping syndrome: no  Recent physical activity:  walking 7 days a  week 40 minutes not lackadazacle: 10000 steps a day, 1-2 days at the gym 90 15-30 minutes; pickle ball once a week  Progress Towards Goal(s):  In Progress Teaching method utilized: Patent attorney & Auditory  Demonstrated degree of understanding via: Teach Back  Readiness Level: Action Barriers to learning/adherence to lifestyle change: none identified   Teaching Method Utilized:  Visual Auditory Hands on  Demonstrated degree of understanding via:  Teach Back   Monitoring/Evaluation:  Dietary intake, exercise, and body weight. Follow up

## 2022-12-12 ENCOUNTER — Encounter: Payer: Self-pay | Admitting: Skilled Nursing Facility1

## 2022-12-12 ENCOUNTER — Encounter: Payer: 59 | Attending: General Surgery | Admitting: Skilled Nursing Facility1

## 2022-12-12 VITALS — Ht 67.0 in | Wt 222.6 lb

## 2022-12-12 DIAGNOSIS — E669 Obesity, unspecified: Secondary | ICD-10-CM | POA: Insufficient documentation

## 2022-12-12 NOTE — Progress Notes (Signed)
Follow-up visit:  Post-Operative sleeve Surgery  Medical Nutrition Therapy:    Primary concerns today: Post-operative Bariatric Surgery Nutrition Management   Surgery date: 01/29/2022 Surgery type: Sleeve  NUTRITION ASSESSMENT  Anthropometrics  Start weight at [facility]: 293.5 lbs (date: 12/14/2021)  Height: 67 in Weight today: 222.6   Clinical  Medical hx: HTN, obesity Medications: Lisinopril/HCT2, Adderall XR Labs: WNL Notable signs/symptoms: nothing noted Any previous deficiencies? No Bowel Habits: Every day to every other day no complaints   Body Composition Scale 03/26/2022 07/10/2022 10/02/2022 12/12/2022  Current Body Weight 264.5 243.5 233.1 222.6  Total Body Fat % 44.1 41.9 40.7 39.4  Visceral Fat 13 11 11 10   Fat-Free Mass % 55.8 58 59.2 60.5   Total Body Water % 42.4 43.5 44.1 44.7  Muscle-Mass lbs 35.3 35 34.9 34.8  BMI 41.2 37.8 36.2 34.6  Body Fat Displacement             Torso  lbs 72.3 63.2 58.8 54.4         Left Leg  lbs 14.4 12.6 11.7 10.8         Right Leg  lbs 14.4 12.6 11.7 10.8         Left Arm  lbs 7.2 6.3 5.8 5.4         Right Arm   lbs 7.2 6.3 5.8 5.4    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  Pt states she loves her new job with less mircomanaging and off 3 days a week.  Pt states she still wants her weight to come off faster but is trying to accept that. Pt states she is trying to eat whole food sand not just do protein shake.  Pt states she is trying to stop at fulness and not after one of the strategies being chewing well. Pt states she realized she does not want to have compensatory behavior.   Pt ahs done amazingly well working on her relationship with food.   24 hr recall: Breakfast: pro yogurt + berries + peanut butter + granola or boiled egg + tuna Snack:  Lunch: chicken sausage and veggies Snack: almonds Dinner: fish and mac n cheese Snack:  Beverages: 40 ounces flavored water, regular coffee, cenna tea   Fluid intake:  adequate   Medications: See List Supplementation: appropriate    Using straws: no Drinking while eating: no Having you been chewing well: yes Chewing/swallowing difficulties: no Changes in vision: no Changes to mood/headaches: no Hair loss/Cahnges to skin/Changes to nails: no Any difficulty focusing or concentrating: no Sweating: no Dizziness/Lightheaded: no Palpitations: no  Carbonated beverages: no N/V/D/C/GAS: no Abdominal Pain: no Dumping syndrome: no  Recent physical activity:  Gym 2-3 days a week 30-45 min cardio and then weight lifting; pickle ball 1-5 times a week   Progress Towards Goal(s):  In Progress Teaching method utilized: Patent attorney & Auditory  Demonstrated degree of understanding via: Teach Back  Readiness Level: Action Barriers to learning/adherence to lifestyle change: none identified  Teaching Method Utilized:  Visual Auditory Hands on  Demonstrated degree of understanding via:  Teach Back   Monitoring/Evaluation:  Dietary intake, exercise, and body weight. Follow up in October

## 2023-02-11 ENCOUNTER — Ambulatory Visit: Payer: 59 | Admitting: Dietician

## 2023-06-15 DIAGNOSIS — B001 Herpesviral vesicular dermatitis: Secondary | ICD-10-CM | POA: Insufficient documentation

## 2023-06-15 DIAGNOSIS — K219 Gastro-esophageal reflux disease without esophagitis: Secondary | ICD-10-CM | POA: Insufficient documentation

## 2023-09-05 ENCOUNTER — Encounter (HOSPITAL_COMMUNITY): Payer: Self-pay | Admitting: *Deleted

## 2024-01-29 ENCOUNTER — Other Ambulatory Visit: Payer: Self-pay | Admitting: Nurse Practitioner

## 2024-01-29 DIAGNOSIS — F900 Attention-deficit hyperactivity disorder, predominantly inattentive type: Secondary | ICD-10-CM

## 2024-01-29 MED ORDER — AMPHETAMINE-DEXTROAMPHET ER 20 MG PO CP24: 20.0000 mg | ORAL_CAPSULE | ORAL | 0 refills | Status: AC

## 2024-02-20 ENCOUNTER — Encounter: Payer: Self-pay | Admitting: Nurse Practitioner

## 2024-02-20 ENCOUNTER — Ambulatory Visit: Admitting: Nurse Practitioner

## 2024-02-20 VITALS — BP 130/86 | HR 55 | Ht 66.5 in | Wt 209.4 lb

## 2024-02-20 DIAGNOSIS — Z0000 Encounter for general adult medical examination without abnormal findings: Secondary | ICD-10-CM

## 2024-02-20 DIAGNOSIS — B009 Herpesviral infection, unspecified: Secondary | ICD-10-CM | POA: Diagnosis not present

## 2024-02-20 DIAGNOSIS — F900 Attention-deficit hyperactivity disorder, predominantly inattentive type: Secondary | ICD-10-CM

## 2024-02-20 DIAGNOSIS — K219 Gastro-esophageal reflux disease without esophagitis: Secondary | ICD-10-CM | POA: Diagnosis not present

## 2024-02-20 DIAGNOSIS — E66813 Obesity, class 3: Secondary | ICD-10-CM

## 2024-02-20 DIAGNOSIS — B001 Herpesviral vesicular dermatitis: Secondary | ICD-10-CM | POA: Diagnosis not present

## 2024-02-20 DIAGNOSIS — I10 Essential (primary) hypertension: Secondary | ICD-10-CM

## 2024-02-20 DIAGNOSIS — E6609 Other obesity due to excess calories: Secondary | ICD-10-CM

## 2024-02-20 DIAGNOSIS — Z6833 Body mass index (BMI) 33.0-33.9, adult: Secondary | ICD-10-CM

## 2024-02-20 DIAGNOSIS — Z6841 Body Mass Index (BMI) 40.0 and over, adult: Secondary | ICD-10-CM

## 2024-02-20 DIAGNOSIS — Z23 Encounter for immunization: Secondary | ICD-10-CM

## 2024-02-20 DIAGNOSIS — E66811 Obesity, class 1: Secondary | ICD-10-CM

## 2024-02-20 MED ORDER — PANTOPRAZOLE SODIUM 40 MG PO TBEC: 40.0000 mg | DELAYED_RELEASE_TABLET | Freq: Every day | ORAL | 3 refills | Status: AC

## 2024-02-20 MED ORDER — VALACYCLOVIR HCL 1 G PO TABS: 2000.0000 mg | ORAL_TABLET | Freq: Two times a day (BID) | ORAL | 5 refills | Status: AC

## 2024-02-20 NOTE — Assessment & Plan Note (Signed)
 ADHD managed with Adderall, effective for work-related tasks. No issues with sleep or appetite. Previous higher dose caused palpitations. - Continue Adderall on workdays as needed - Monitor blood pressure before and after taking Adderall - Change the pharmacy on future refills [facility].

## 2024-02-20 NOTE — Assessment & Plan Note (Signed)

## 2024-02-20 NOTE — Assessment & Plan Note (Signed)
 GERD managed with pantoprazole . Refill due soon. - Refill pantoprazole  prescription

## 2024-02-20 NOTE — Progress Notes (Signed)
 Laura Doing, DNP, AGNP-c [facility]  [address] [place], [place] [address] [phone]  ESTABLISHED PATIENT- Chronic Health and/or Follow-Up Visit on 02/20/2024  Blood pressure 130/86, pulse (!) 55, height 5' 6.5 (1.689 m), weight 209 lb 6.4 oz (95 kg), last menstrual period 02/13/2024, SpO2 99%.   HPI:  History of Present Illness Laura Werner is a 38 year old female with ADHD who presents to re-establish care and annual exam  She is currently taking Adderall primarily on workdays to manage her ADHD symptoms. She reports that she takes Adderall on workdays to help with focus during task-oriented activities at work and does not feel the need to take it on non-workdays. She previously tried a higher dose of 30 mg, which caused palpitations, so she prefers to stay on her current dose. She typically takes the medication around lunchtime, and it lasts through her workday, which varies in length. No issues with sleep or appetite while on the medication.  She is also taking pantoprazole  and mentions needing a refill soon. She last received it on October 6th and will require more by next week.   Additionally, she uses Valaciclovir for cold sore outbreaks, taking four pills at a time during an outbreak, and requests a refill to have on hand.  She has a history of high blood pressure, previously managed with lisinopril  HCTZ, which was discontinued after her weight loss surgery. Her blood pressure has been trending upwards, with recent readings around 130/80s when taken during doctors visits.  She experiences temple headaches as a sign of elevated blood pressure but has not had these symptoms recently. She does not regularly monitor her blood pressure at home but plans to do so with her partner's assistance.  She underwent weight loss surgery and has maintained her weight for about a year, though she aims to lose an additional 20 pounds. She experiences hypoglycemic  symptoms if she does not eat regularly, describing 'low sugar shakes' if she waits too long between meals. Her diet includes a morning bagel, cheese sticks, nuts, and an apple before workouts, with a focus on maintaining protein intake.  She has not had an annual physical this year but had a Pap smear about a month ago. She completed a biometric screening for insurance purposes but has not had recent blood work. All ROS negative with exception of what is listed above.    PHYSICAL EXAM Physical Exam Vitals and nursing note reviewed.  Constitutional:      General: She is not in acute distress.    Appearance: Normal appearance. She is not ill-appearing.  HENT:     Head: Normocephalic and atraumatic.     Right Ear: Tympanic membrane normal.     Left Ear: Tympanic membrane normal.     Nose: Nose normal.     Mouth/Throat:     Mouth: Mucous membranes are moist.     Pharynx: Oropharynx is clear.  Eyes:     Conjunctiva/sclera: Conjunctivae normal.     Pupils: Pupils are equal, round, and reactive to light.  Neck:     Vascular: No carotid bruit.  Cardiovascular:     Rate and Rhythm: Normal rate and regular rhythm.     Pulses: Normal pulses.     Heart sounds: Normal heart sounds.  Pulmonary:     Effort: Pulmonary effort is normal.     Breath sounds: Normal breath sounds.  Abdominal:     General: Bowel sounds are normal. There is no distension.  Palpations: Abdomen is soft.     Tenderness: There is no abdominal tenderness. There is no right CVA tenderness, left CVA tenderness or guarding.  Musculoskeletal:        General: Normal range of motion.     Cervical back: No tenderness.     Right lower leg: No edema.     Left lower leg: No edema.  Lymphadenopathy:     Cervical: No cervical adenopathy.  Skin:    General: Skin is warm and dry.     Capillary Refill: Capillary refill takes less than 2 seconds.  Neurological:     General: No focal deficit present.     Mental Status: She is  alert and oriented to person, place, and time.     Sensory: No sensory deficit.     Motor: No weakness.     Coordination: Coordination normal.  Psychiatric:        Mood and Affect: Mood normal.     PLAN Problem List Items Addressed This Visit     Class 1 obesity due to excess calories with body mass index (BMI) of 33.0 to 33.9 in adult   Weight stabilized around 200 pounds. Her BMI has decreased from 45 to 33, which is excellent! Experiences hypoglycemic symptoms if meals are skipped or delayed. Maintaining a healthy diet and exercise routine. Discussion on increased small meal/snack intake throughout the day to reduce hypoglycemia risk and aid in weight management.  - Encourage small, frequent meals to prevent hypoglycemia: high protein snacks multiple times a day are better than large spread out meals.  - Focus on protein intake and avoid calorie restriction to 1200 calories. I do not recommend going less than 1500 calories.      Attention deficit hyperactivity disorder (ADHD)   ADHD managed with Adderall, effective for work-related tasks. No issues with sleep or appetite. Previous higher dose caused palpitations. - Continue Adderall on workdays as needed - Monitor blood pressure before and after taking Adderall - [facility] the [facility].       Essential hypertension   Blood pressure trending upwards, currently around 130/80s. Previously on lisinopril  HCTZ, discontinued post-bariatric surgery. Occasional headaches noted as a sign of elevated blood pressure. No at home BP readings to confirm office readings are not related to white coat HTN.  - Monitor blood pressure at home every other day for a few weeks - Report if blood pressure consistently exceeds 135/85 - Consider reinitiating HCTZ if blood pressure trends higher       Encounter for annual physical exam - Primary   CPE completed today. Review of HM activities and recommendations  discussed and provided on AVS. Anticipatory guidance, diet, and exercise recommendations provided. Medications, allergies, and hx reviewed and updated as necessary. Orders placed as listed below.  Plan: - Labs ordered. Will make changes as necessary based on results.  - I will review these results and send recommendations via MyChart or a telephone call.  - F/U with CPE in 1 year or sooner for acute/chronic health needs as directed.        Relevant Orders   Hemoglobin A1c   CBC with Differential/Platelet   Comprehensive metabolic panel with GFR   Lipid panel   VITAMIN D  25 Hydroxy (Vit-D Deficiency, Fractures)   Herpes labialis   Intermittent outbreaks typically treated with 4 day course of valacyclovir . Refills needed.       Relevant Medications   valACYclovir  (VALTREX ) 1000 MG  tablet   Gastroesophageal reflux disease   GERD managed with pantoprazole . Refill due soon. - Refill pantoprazole  prescription      Relevant Medications   pantoprazole  (PROTONIX ) 40 MG tablet   Other Relevant Orders   Hemoglobin A1c   CBC with Differential/Platelet   Comprehensive metabolic panel with GFR   Lipid panel   VITAMIN D  25 Hydroxy (Vit-D Deficiency, Fractures)   Other Visit Diagnoses       HSV-1 infection       Relevant Medications   valACYclovir  (VALTREX ) 1000 MG tablet     Need for Tdap vaccination       Relevant Orders   Tdap vaccine greater than or equal to 7yo IM         Return in about 6 months (around 08/20/2024) for Med Management 30.  SaraBeth Trinitee Horgan, DNP, AGNP-c.  .time

## 2024-02-20 NOTE — Assessment & Plan Note (Signed)
 Intermittent outbreaks typically treated with 4 day course of valacyclovir . Refills needed.

## 2024-02-20 NOTE — Patient Instructions (Addendum)
 Check your BP at home for the next couple of weeks and let me know what the readings are. If it is running high we can always consider starting a low dose hydrochlorothiazide .  WEIGHT LOSS PLANNING  For best management of weight, it is vital to balance intake versus output. This means the number of calories burned per day must be less than the calories you take in with food and drink.   I recommend trying to follow a diet with the following: Calories: 1500-2000 calories per day Carbohydrates: 150-180 grams of carbohydrates per day  Why: Gives your body enough quick fuel for cells to maintain normal function without sending them into starvation mode.  Protein: At least 90 grams of protein per day- 30 grams with each meal Why: Protein takes longer and uses more energy than carbohydrates to break down for fuel. The carbohydrates in your meals serves as quick energy sources and proteins help use some of that extra quick energy to break down to produce long term energy. This helps you not feel hungry as quickly and protein breakdown burns calories.  Water: Drink AT LEAST 64 ounces of water per day  Why: Water is essential to healthy metabolism. Water helps to fill the stomach and keep you fuller longer. Water is required for healthy digestion and filtering of waste in the body.  Fat: Limit fats in your diet- when choosing fats, choose foods with lower fats content such as lean meats (chicken, fish, [place]).  Why: Increased fat intake leads to storage for later. Once you burn your carbohydrate energy, your body goes into fat and protein breakdown mode to help you loose weight.  Cholesterol: Fats and oils that are LIQUID at room temperature are best. Choose vegetable oils (olive oil, avocado oil, nuts). Avoid fats that are SOLID at room temperature (animal fats, processed meats). Healthy fats are often found in whole grains, beans, nuts, seeds, and berries.  Why: Elevated cholesterol levels lead to  build up of cholesterol on the inside of your blood vessels. This will eventually cause the blood vessels to become hard and can lead to high blood pressure and damage to your organs. When the blood flow is reduced, but the pressure is high from cholesterol buildup, parts of the cholesterol can break off and form clots that can go to the brain or heart leading to a stroke or heart attack.  Fiber: Increase amount of SOLUBLE the fiber in your diet. This helps to fill you up, lowers cholesterol, and helps with digestion. Some foods high in soluble fiber are oats, peas, beans, apples, carrots, barley, and citrus fruits.   Why: Fiber fills you up, helps remove excess cholesterol, and aids in healthy digestion which are all very important in weight management.   I recommend the following as a minimum activity routine: Purposeful walk or other physical activity at least 20 minutes every single day. This means purposefully taking a walk, jog, bike, swim, treadmill, elliptical, dance, etc.  This activity should be ABOVE your normal daily activities, such as walking at work. Goal exercise should be at least 150 minutes a week- work your way up to this.   Heart Rate: Your maximum exercise heart rate should be 220 - Your Age in Years. When exercising, get your heart rate up, but avoid going over the maximum targeted heart rate.  60-70% of your maximum heart rate is where you tend to burn the most fat. To find this number:  33 - Age In Years=  Max HR  Max HR x 0.6 (or 0.7) = Fat Burning HR The Fat Burning HR is your goal heart rate while working out to burn the most fat.  NEVER exercise to the point your feel lightheaded, weak, nauseated, dizzy. If you experience ANY of these symptoms- STOP exercise! Allow yourself to cool down and your heart rate to come down. Then restart slower next time.  If at ANY TIME you feel chest pain or chest pressure during exercise, STOP IMMEDIATELY and seek medical attention.      For all adult patients, I recommend A well balanced diet low in saturated fats, cholesterol, and moderation in carbohydrates.   This can be as simple as monitoring portion sizes and cutting back on sugary beverages such as soda and juice to start with.    Daily water consumption of at least 64 ounces.  Physical activity at least 180 minutes per week, if just starting out.   This can be as simple as taking the stairs instead of the elevator and walking 2-3 laps around the office  purposefully every day.   STD protection, partner selection, and regular testing if high risk.  Limited consumption of alcoholic beverages if alcohol is consumed.  For women, I recommend no more than 7 alcoholic beverages per week, spread out throughout the week.  Avoid binge drinking or consuming large quantities of alcohol in one setting.   Please let me know if you feel you may need help with reduction or quitting alcohol consumption.   Avoidance of nicotine, if used.  Please let me know if you feel you may need help with reduction or quitting nicotine use.   Daily mental health attention.  This can be in the form of 5 minute daily meditation, prayer, journaling, yoga, reflection, etc.   Purposeful attention to your emotions and mental state can significantly improve your overall wellbeing  and  Health.  Please know that I am here to help you with all of your health care goals and am happy to work with you to find a solution that works best for you.  The greatest advice I have received with any changes in life are to take it one step at a time, that even means if all you can focus on is the next 60 seconds, then do that and celebrate your victories.  With any changes in life, you will have set backs, and that is OK. The important thing to remember is, if you have a set back, it is not a failure, it is an opportunity to try again!  Health Maintenance Recommendations Screening Testing Mammogram Every 1  -2 years based on history and risk factors Starting at age 70 Pap Smear Ages 21-39 every 3 years Ages 108-65 every 5 years with HPV testing More frequent testing may be required based on results and history Colon Cancer Screening Every 1-10 years based on test performed, risk factors, and history Starting at age 56 Bone Density Screening Every 2-10 years based on history Starting at age 38 for women Recommendations for men differ based on medication usage, history, and risk factors AAA Screening One time ultrasound Men 9-78 years old who have every smoked Lung Cancer Screening Low Dose Lung CT every 12 months Age 57-80 years with a 30 pack-year smoking history who still smoke or who have quit within the last 15 years  Screening Labs Routine  Labs: Complete Blood Count (CBC), Complete Metabolic Panel (CMP), Cholesterol (Lipid Panel) Every 6-12 months based on  history and medications May be recommended more frequently based on current conditions or previous results Hemoglobin A1c Lab Every 3-12 months based on history and previous results Starting at age 41 or earlier with diagnosis of diabetes, high cholesterol, BMI >26, and/or risk factors Frequent monitoring for patients with diabetes to ensure blood sugar control Thyroid Panel (TSH w/ T3 & T4) Every 6 months based on history, symptoms, and risk factors May be repeated more often if on medication HIV One time testing for all patients 68 and older May be repeated more frequently for patients with increased risk factors or exposure Hepatitis C One time testing for all patients 16 and older May be repeated more frequently for patients with increased risk factors or exposure Gonorrhea, Chlamydia Every 12 months for all sexually active persons 13-24 years Additional monitoring may be recommended for those who are considered high risk or who have symptoms PSA Men 59-89 years old with risk factors Additional screening may be  recommended from age 61-69 based on risk factors, symptoms, and history  Vaccine Recommendations Tetanus Booster All adults every 10 years Flu Vaccine All patients 6 months and older every year COVID Vaccine All patients 12 years and older Initial dosing with booster May recommend additional booster based on age and health history HPV Vaccine 2 doses all patients age 76-26 Dosing may be considered for patients over 26 Shingles Vaccine (Shingrix) 2 doses all adults 55 years and older Pneumonia (Pneumovax 23) All adults 65 years and older May recommend earlier dosing based on health history Pneumonia (Prevnar 55) All adults 65 years and older Dosed 1 year after Pneumovax 23  Additional Screening, Testing, and Vaccinations may be recommended on an individualized basis based on family history, health history, risk factors, and/or exposure.

## 2024-02-20 NOTE — Assessment & Plan Note (Signed)
 Blood pressure trending upwards, currently around 130/80s. Previously on lisinopril  HCTZ, discontinued post-bariatric surgery. Occasional headaches noted as a sign of elevated blood pressure. No at home BP readings to confirm office readings are not related to white coat HTN.  - Monitor blood pressure at home every other day for a few weeks - Report if blood pressure consistently exceeds 135/85 - Consider reinitiating HCTZ if blood pressure trends higher

## 2024-02-20 NOTE — Assessment & Plan Note (Addendum)
 Weight stabilized around 200 pounds. Her BMI has decreased from 45 to 33, which is excellent! Experiences hypoglycemic symptoms if meals are skipped or delayed. Maintaining a healthy diet and exercise routine. Discussion on increased small meal/snack intake throughout the day to reduce hypoglycemia risk and aid in weight management.  - Encourage small, frequent meals to prevent hypoglycemia: high protein snacks multiple times a day are better than large spread out meals.  - Focus on protein intake and avoid calorie restriction to 1200 calories. I do not recommend going less than 1500 calories.

## 2024-02-21 LAB — LIPID PANEL
Chol/HDL Ratio: 2.4 ratio (ref 0.0–4.4)
Cholesterol, Total: 160 mg/dL (ref 100–199)
HDL: 67 mg/dL (ref >39)
LDL Chol Calc (NIH): 82 mg/dL (ref 0–99)
Triglycerides: 54 mg/dL (ref 0–149)
VLDL Cholesterol Cal: 11 mg/dL (ref 5–40)

## 2024-02-21 LAB — CBC WITH DIFFERENTIAL/PLATELET
Basophils Absolute: 0 x10E3/uL (ref 0.0–0.2)
Basos: 0 %
EOS (ABSOLUTE): 0.1 x10E3/uL (ref 0.0–0.4)
Eos: 1 %
Hematocrit: 40.6 % (ref 34.0–46.6)
Hemoglobin: 13.1 g/dL (ref 11.1–15.9)
Immature Grans (Abs): 0 x10E3/uL (ref 0.0–0.1)
Immature Granulocytes: 0 %
Lymphocytes Absolute: 2 x10E3/uL (ref 0.7–3.1)
Lymphs: 34 %
MCH: 27.1 pg (ref 26.6–33.0)
MCHC: 32.3 g/dL (ref 31.5–35.7)
MCV: 84 fL (ref 79–97)
Monocytes Absolute: 0.4 x10E3/uL (ref 0.1–0.9)
Monocytes: 7 %
Neutrophils Absolute: 3.2 x10E3/uL (ref 1.4–7.0)
Neutrophils: 58 %
Platelets: 187 x10E3/uL (ref 150–450)
RBC: 4.83 x10E6/uL (ref 3.77–5.28)
RDW: 14.2 % (ref 11.7–15.4)
WBC: 5.7 x10E3/uL (ref 3.4–10.8)

## 2024-02-21 LAB — COMPREHENSIVE METABOLIC PANEL WITH GFR
ALT: 16 IU/L (ref 0–32)
AST: 19 IU/L (ref 0–40)
Albumin: 4 g/dL (ref 3.9–4.9)
Alkaline Phosphatase: 81 IU/L (ref 41–116)
BUN/Creatinine Ratio: 17 (ref 9–23)
BUN: 14 mg/dL (ref 6–20)
Bilirubin Total: 0.2 mg/dL (ref 0.0–1.2)
CO2: 26 mmol/L (ref 20–29)
Calcium: 9.2 mg/dL (ref 8.7–10.2)
Chloride: 102 mmol/L (ref 96–106)
Creatinine, Ser: 0.81 mg/dL (ref 0.57–1.00)
Globulin, Total: 2.1 g/dL (ref 1.5–4.5)
Glucose: 84 mg/dL (ref 70–99)
Potassium: 4.1 mmol/L (ref 3.5–5.2)
Sodium: 140 mmol/L (ref 134–144)
Total Protein: 6.1 g/dL (ref 6.0–8.5)
eGFR: 96 mL/min/1.73 (ref >59)

## 2024-02-21 LAB — HEMOGLOBIN A1C
Est. average glucose Bld gHb Est-mCnc: 94 mg/dL
Hgb A1c MFr Bld: 4.9 % (ref 4.8–5.6)

## 2024-02-21 LAB — VITAMIN D 25 HYDROXY (VIT D DEFICIENCY, FRACTURES): Vit D, 25-Hydroxy: 86.6 ng/mL (ref 30.0–100.0)

## 2024-02-25 ENCOUNTER — Other Ambulatory Visit

## 2024-02-26 ENCOUNTER — Ambulatory Visit: Payer: Self-pay | Admitting: Nurse Practitioner

## 2024-08-25 ENCOUNTER — Ambulatory Visit: Payer: Self-pay | Admitting: Nurse Practitioner

## 2025-03-03 ENCOUNTER — Encounter: Payer: Self-pay | Admitting: Nurse Practitioner
